# Patient Record
Sex: Female | Born: 1950
Health system: Southern US, Community
[De-identification: ages and names within clinical notes are randomized; demographics above are authoritative.]

## PROBLEM LIST (undated history)

## (undated) DIAGNOSIS — J302 Other seasonal allergic rhinitis: Secondary | ICD-10-CM

## (undated) DIAGNOSIS — T7840XA Allergy, unspecified, initial encounter: Secondary | ICD-10-CM

## (undated) DIAGNOSIS — E785 Hyperlipidemia, unspecified: Secondary | ICD-10-CM

## (undated) DIAGNOSIS — I10 Essential (primary) hypertension: Secondary | ICD-10-CM

## (undated) DIAGNOSIS — E669 Obesity, unspecified: Secondary | ICD-10-CM

## (undated) DIAGNOSIS — E559 Vitamin D deficiency, unspecified: Secondary | ICD-10-CM

## (undated) DIAGNOSIS — G5600 Carpal tunnel syndrome, unspecified upper limb: Secondary | ICD-10-CM

## (undated) HISTORY — DX: Vitamin D deficiency, unspecified: E55.9

## (undated) HISTORY — DX: Allergy, unspecified, initial encounter: T78.40XA

## (undated) HISTORY — DX: Obesity, unspecified: E66.9

## (undated) HISTORY — DX: Essential (primary) hypertension: I10

## (undated) HISTORY — DX: Hyperlipidemia, unspecified: E78.5

## (undated) HISTORY — DX: Carpal tunnel syndrome, unspecified upper limb: G56.00

## (undated) HISTORY — DX: Other seasonal allergic rhinitis: J30.2

---
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
## 1974-01-16 HISTORY — PX: OTHER SURGICAL HISTORY: SHX169

## 1975-01-17 HISTORY — PX: CYST EXCISION: SHX5701

## 1979-01-17 HISTORY — PX: KNEE ARTHROTOMY: SUR107

## 1994-01-16 HISTORY — PX: CYST REMOVAL HAND: SHX6279

## 1995-01-17 HISTORY — PX: CERVICAL DISCECTOMY: SHX98

## 1999-01-06 ENCOUNTER — Other Ambulatory Visit: Admission: RE | Admit: 1999-01-06 | Discharge: 1999-01-06 | Payer: Self-pay | Admitting: Obstetrics and Gynecology

## 1999-01-27 ENCOUNTER — Encounter: Admission: RE | Admit: 1999-01-27 | Discharge: 1999-01-27 | Payer: Self-pay | Admitting: Obstetrics and Gynecology

## 1999-01-27 ENCOUNTER — Encounter: Payer: Self-pay | Admitting: Obstetrics and Gynecology

## 2000-01-30 ENCOUNTER — Encounter: Admission: RE | Admit: 2000-01-30 | Discharge: 2000-01-30 | Payer: Self-pay | Admitting: Obstetrics and Gynecology

## 2000-01-30 ENCOUNTER — Encounter: Payer: Self-pay | Admitting: Obstetrics and Gynecology

## 2000-03-08 ENCOUNTER — Other Ambulatory Visit: Admission: RE | Admit: 2000-03-08 | Discharge: 2000-03-08 | Payer: Self-pay | Admitting: Obstetrics and Gynecology

## 2001-04-05 ENCOUNTER — Encounter: Admission: RE | Admit: 2001-04-05 | Discharge: 2001-04-05 | Payer: Self-pay | Admitting: Obstetrics and Gynecology

## 2001-04-05 ENCOUNTER — Encounter: Payer: Self-pay | Admitting: Obstetrics and Gynecology

## 2001-05-23 ENCOUNTER — Other Ambulatory Visit: Admission: RE | Admit: 2001-05-23 | Discharge: 2001-05-23 | Payer: Self-pay | Admitting: Obstetrics and Gynecology

## 2002-04-09 ENCOUNTER — Encounter: Admission: RE | Admit: 2002-04-09 | Discharge: 2002-04-09 | Payer: Self-pay | Admitting: Obstetrics and Gynecology

## 2002-04-09 ENCOUNTER — Encounter: Payer: Self-pay | Admitting: Obstetrics and Gynecology

## 2002-06-26 ENCOUNTER — Other Ambulatory Visit: Admission: RE | Admit: 2002-06-26 | Discharge: 2002-06-26 | Payer: Self-pay | Admitting: Obstetrics and Gynecology

## 2003-05-26 ENCOUNTER — Encounter: Admission: RE | Admit: 2003-05-26 | Discharge: 2003-05-26 | Payer: Self-pay | Admitting: Obstetrics and Gynecology

## 2003-12-07 ENCOUNTER — Ambulatory Visit: Payer: Self-pay | Admitting: Gastroenterology

## 2003-12-21 ENCOUNTER — Ambulatory Visit: Payer: Self-pay | Admitting: Gastroenterology

## 2004-06-01 ENCOUNTER — Encounter: Admission: RE | Admit: 2004-06-01 | Discharge: 2004-06-01 | Payer: Self-pay | Admitting: Obstetrics and Gynecology

## 2005-06-13 ENCOUNTER — Encounter: Admission: RE | Admit: 2005-06-13 | Discharge: 2005-06-13 | Payer: Self-pay | Admitting: Obstetrics and Gynecology

## 2007-10-24 ENCOUNTER — Encounter: Admission: RE | Admit: 2007-10-24 | Discharge: 2007-10-24 | Payer: Self-pay | Admitting: Obstetrics and Gynecology

## 2008-12-24 ENCOUNTER — Encounter: Admission: RE | Admit: 2008-12-24 | Discharge: 2008-12-24 | Payer: Self-pay | Admitting: Obstetrics and Gynecology

## 2010-04-19 ENCOUNTER — Other Ambulatory Visit: Payer: Self-pay | Admitting: Obstetrics and Gynecology

## 2010-04-19 DIAGNOSIS — Z1231 Encounter for screening mammogram for malignant neoplasm of breast: Secondary | ICD-10-CM

## 2010-04-25 ENCOUNTER — Other Ambulatory Visit: Payer: Self-pay | Admitting: Dermatology

## 2010-04-28 ENCOUNTER — Ambulatory Visit
Admission: RE | Admit: 2010-04-28 | Discharge: 2010-04-28 | Disposition: A | Discharge status: Home / Self Care | Payer: BLUE CROSS/BLUE SHIELD | Source: Ambulatory Visit | Attending: Obstetrics and Gynecology | Admitting: Obstetrics and Gynecology

## 2010-04-28 DIAGNOSIS — Z1231 Encounter for screening mammogram for malignant neoplasm of breast: Secondary | ICD-10-CM

## 2010-04-29 ENCOUNTER — Other Ambulatory Visit: Payer: Self-pay | Admitting: Obstetrics and Gynecology

## 2010-04-29 DIAGNOSIS — R928 Other abnormal and inconclusive findings on diagnostic imaging of breast: Secondary | ICD-10-CM

## 2010-05-04 ENCOUNTER — Ambulatory Visit
Admission: RE | Admit: 2010-05-04 | Discharge: 2010-05-04 | Disposition: A | Discharge status: Home / Self Care | Payer: BLUE CROSS/BLUE SHIELD | Source: Ambulatory Visit | Attending: Obstetrics and Gynecology | Admitting: Obstetrics and Gynecology

## 2010-05-04 DIAGNOSIS — R928 Other abnormal and inconclusive findings on diagnostic imaging of breast: Secondary | ICD-10-CM

## 2010-06-20 ENCOUNTER — Other Ambulatory Visit: Payer: Self-pay | Admitting: Obstetrics and Gynecology

## 2010-07-25 ENCOUNTER — Other Ambulatory Visit: Payer: Self-pay | Admitting: Dermatology

## 2011-01-17 HISTORY — PX: ROTATOR CUFF REPAIR: SHX139

## 2011-05-16 ENCOUNTER — Other Ambulatory Visit: Payer: Self-pay | Admitting: Dermatology

## 2011-05-24 ENCOUNTER — Other Ambulatory Visit: Payer: Self-pay | Admitting: Obstetrics and Gynecology

## 2011-05-24 DIAGNOSIS — Z1231 Encounter for screening mammogram for malignant neoplasm of breast: Secondary | ICD-10-CM

## 2011-06-06 ENCOUNTER — Ambulatory Visit: Payer: BLUE CROSS/BLUE SHIELD

## 2011-06-06 ENCOUNTER — Ambulatory Visit
Admission: RE | Admit: 2011-06-06 | Discharge: 2011-06-06 | Disposition: A | Discharge status: Home / Self Care | Payer: BC Managed Care – PPO | Source: Ambulatory Visit | Attending: Obstetrics and Gynecology | Admitting: Obstetrics and Gynecology

## 2011-06-06 DIAGNOSIS — Z1231 Encounter for screening mammogram for malignant neoplasm of breast: Secondary | ICD-10-CM

## 2011-11-16 ENCOUNTER — Encounter: Payer: Self-pay | Admitting: Gastroenterology

## 2012-02-09 ENCOUNTER — Other Ambulatory Visit: Payer: Self-pay | Admitting: Dermatology

## 2012-05-21 ENCOUNTER — Other Ambulatory Visit: Payer: Self-pay | Admitting: Dermatology

## 2012-07-22 ENCOUNTER — Other Ambulatory Visit: Payer: Self-pay

## 2012-07-22 DIAGNOSIS — Z1231 Encounter for screening mammogram for malignant neoplasm of breast: Secondary | ICD-10-CM

## 2012-08-08 ENCOUNTER — Ambulatory Visit
Admission: RE | Admit: 2012-08-08 | Discharge: 2012-08-08 | Disposition: A | Discharge status: Home / Self Care | Payer: BC Managed Care – PPO | Source: Ambulatory Visit

## 2012-08-08 DIAGNOSIS — Z1231 Encounter for screening mammogram for malignant neoplasm of breast: Secondary | ICD-10-CM

## 2012-09-18 ENCOUNTER — Encounter: Payer: Self-pay | Admitting: Gastroenterology

## 2013-05-15 ENCOUNTER — Encounter: Payer: Self-pay | Admitting: Gastroenterology

## 2013-06-11 ENCOUNTER — Ambulatory Visit (AMBULATORY_SURGERY_CENTER): Payer: Self-pay | Admitting: *Deleted

## 2013-06-11 VITALS — Ht 63.0 in | Wt 184.2 lb

## 2013-06-11 DIAGNOSIS — Z1211 Encounter for screening for malignant neoplasm of colon: Secondary | ICD-10-CM

## 2013-06-11 MED ORDER — MOVIPREP 100 G PO SOLR: ORAL | Status: DC

## 2013-06-11 NOTE — Progress Notes (Signed)
No allergies to eggs or soy. No problems with anesthesia.  Pt given Emmi instructions for colonoscopy  No oxygen use  Takes Qsymia; was told to stop 10 days before colonoscopy

## 2013-06-12 ENCOUNTER — Encounter: Payer: Self-pay | Admitting: Gastroenterology

## 2013-06-16 ENCOUNTER — Encounter: Payer: Self-pay | Admitting: Gastroenterology

## 2013-06-25 ENCOUNTER — Ambulatory Visit (AMBULATORY_SURGERY_CENTER): Payer: BC Managed Care – PPO | Admitting: Gastroenterology

## 2013-06-25 ENCOUNTER — Encounter: Payer: Self-pay | Admitting: Gastroenterology

## 2013-06-25 VITALS — BP 150/77 | HR 62 | Temp 97.5°F | Resp 21 | Ht 63.0 in | Wt 184.0 lb

## 2013-06-25 DIAGNOSIS — D126 Benign neoplasm of colon, unspecified: Secondary | ICD-10-CM

## 2013-06-25 DIAGNOSIS — Z1211 Encounter for screening for malignant neoplasm of colon: Secondary | ICD-10-CM

## 2013-06-25 MED ORDER — SODIUM CHLORIDE 0.9 % IV SOLN: 500.0000 mL | INTRAVENOUS | Status: DC

## 2013-06-25 NOTE — Op Note (Signed)
[place]  [address]. [place], [id number]   COLONOSCOPY PROCEDURE REPORT  PATIENT: Kara Oneill, Kara Oneill  MR#: [id number] BIRTHDATE: Aug 06, 1950 , 62  yrs. old GENDER: Female ENDOSCOPIST: Ladene Artist, MD, [facility] PROCEDURE DATE:  06/25/2013 PROCEDURE:   Colonoscopy with snare polypectomy First Screening Colonoscopy - Avg.  risk and is 50 yrs.  old or older - No.  Prior Negative Screening - Now for repeat screening. 10 or more years since last screening  History of Adenoma - Now for follow-up colonoscopy & has been > or = to 3 yrs.  N/A  Polyps Removed Today? Yes. ASA CLASS:   Class II INDICATIONS:average risk screening. MEDICATIONS: MAC sedation, administered by CRNA and propofol (Diprivan) 200mg  IV DESCRIPTION OF PROCEDURE:   After the risks benefits and alternatives of the procedure were thoroughly explained, informed consent was obtained.  A digital rectal exam revealed no abnormalities of the rectum.   The LB [id number]  endoscope was introduced through the anus and advanced to the cecum, which was identified by both the appendix and ileocecal valve. No adverse events experienced.   The quality of the prep was excellent, using MoviPrep  The instrument was then slowly withdrawn as the colon was fully examined.  COLON FINDINGS: A sessile polyp measuring 6 mm in size was found in the ascending colon.  A polypectomy was performed with a cold snare.  The resection was complete and the polyp tissue was completely retrieved.   Mild diverticulosis was noted in the sigmoid colon.   The colon was otherwise normal.  There was no diverticulosis, inflammation, polyps or cancers unless previously stated.  Retroflexed views revealed no abnormalities. The time to cecum=2 minutes 08 seconds.  Withdrawal time=13 minutes 19 seconds. The scope was withdrawn and the procedure completed. COMPLICATIONS: There were no complications.  ENDOSCOPIC IMPRESSION: 1.   Sessile  polyp measuring 6 mm in the ascending colon; polypectomy performed with a cold snare 2.   Mild diverticulosis in the sigmoid colon  RECOMMENDATIONS: 1.  Await pathology results 2.  Repeat colonoscopy in 5 years if polyp adenomatous; otherwise 10 years  eSigned:  Ladene Artist, MD, [facility] 06/25/2013 1:55 PM   cc: Domenick Gong, MD

## 2013-06-25 NOTE — Patient Instructions (Signed)
YOU HAD AN ENDOSCOPIC PROCEDURE TODAY AT THE [facility]: Refer to the procedure report that was given to you for any specific questions about what was found during the examination.  If the procedure report does not answer your questions, please call your gastroenterologist to clarify.  If you requested that your care partner not be given the details of your procedure findings, then the procedure report has been included in a sealed envelope for you to review at your convenience later.  YOU SHOULD EXPECT: Some feelings of bloating in the abdomen. Passage of more gas than usual.  Walking can help get rid of the air that was put into your GI tract during the procedure and reduce the bloating. If you had a lower endoscopy (such as a colonoscopy or flexible sigmoidoscopy) you may notice spotting of blood in your stool or on the toilet paper. If you underwent a bowel prep for your procedure, then you may not have a normal bowel movement for a few days.  DIET: Your first meal following the procedure should be a light meal and then it is ok to progress to your normal diet.  A half-sandwich or bowl of soup is an example of a good first meal.  Heavy or fried foods are harder to digest and may make you feel nauseous or bloated.  Likewise meals heavy in dairy and vegetables can cause extra gas to form and this can also increase the bloating.  Drink plenty of fluids but you should avoid alcoholic beverages for 24 hours.  ACTIVITY: Your care partner should take you home directly after the procedure.  You should plan to take it easy, moving slowly for the rest of the day.  You can resume normal activity the day after the procedure however you should NOT DRIVE or use heavy machinery for 24 hours (because of the sedation medicines used during the test).    SYMPTOMS TO REPORT IMMEDIATELY: A gastroenterologist can be reached at any hour.  During normal business hours, 8:30 AM to 5:00 PM Monday through Friday,  call [phone].  After hours and on weekends, please call the GI answering service at [phone] who will take a message and have the physician on call contact you.   Following lower endoscopy (colonoscopy or flexible sigmoidoscopy):  Excessive amounts of blood in the stool  Significant tenderness or worsening of abdominal pains  Swelling of the abdomen that is new, acute  Fever of 100F or higher  FOLLOW UP: If any biopsies were taken you will be contacted by phone or by letter within the next 1-3 weeks.  Call your gastroenterologist if you have not heard about the biopsies in 3 weeks.  Our staff will call the home number listed on your records the next business day following your procedure to check on you and address any questions or concerns that you may have at that time regarding the information given to you following your procedure. This is a courtesy call and so if there is no answer at the home number and we have not heard from you through the emergency physician on call, we will assume that you have returned to your regular daily activities without incident.  SIGNATURES/CONFIDENTIALITY: You and/or your care partner have signed paperwork which will be entered into your electronic medical record.  These signatures attest to the fact that that the information above on your After Visit Summary has been reviewed and is understood.  Full responsibility of the confidentiality of this   discharge information lies with you and/or your care-partner.     Handouts were given to your care partner on polyps, diverticulosis, and a high fiber diet with liberal fluid intake. You may resume your current medications today. Await biopsy results. Please call if any questions or concerns.   

## 2013-06-25 NOTE — Progress Notes (Signed)
No complaints noted in the recovery room. Maw   

## 2013-06-25 NOTE — Progress Notes (Signed)
Called to room to assist during endoscopic procedure.  Patient ID and intended procedure confirmed with present staff. Received instructions for my participation in the procedure from the performing physician.  

## 2013-06-25 NOTE — Progress Notes (Signed)
A/ox3 pleased with MAC, report to Annette RN 

## 2013-06-26 ENCOUNTER — Telehealth: Payer: Self-pay | Admitting: *Deleted

## 2013-06-26 NOTE — Telephone Encounter (Signed)
  Follow up Call-  Call back number 06/25/2013  Post procedure Call Back phone  # [phone] or [phone]  Permission to leave phone message Yes     Patient questions:  Do you have a fever, pain , or abdominal swelling? no Pain Score  0 *  Have you tolerated food without any problems? yes  Have you been able to return to your normal activities? yes  Do you have any questions about your discharge instructions: Diet   no Medications  no Follow up visit  no  Do you have questions or concerns about your Care? no  Actions: * If pain score is 4 or above:0 No action needed, pain <4.

## 2013-07-02 ENCOUNTER — Encounter: Payer: Self-pay | Admitting: Gastroenterology

## 2013-08-15 ENCOUNTER — Encounter: Payer: Self-pay | Admitting: Gastroenterology

## 2014-03-27 ENCOUNTER — Other Ambulatory Visit: Payer: Self-pay

## 2014-03-27 DIAGNOSIS — Z1231 Encounter for screening mammogram for malignant neoplasm of breast: Secondary | ICD-10-CM

## 2014-04-01 ENCOUNTER — Ambulatory Visit
Admission: RE | Admit: 2014-04-01 | Discharge: 2014-04-01 | Disposition: A | Discharge status: Home / Self Care | Payer: BC Managed Care – PPO | Source: Ambulatory Visit

## 2014-04-01 ENCOUNTER — Encounter (INDEPENDENT_AMBULATORY_CARE_PROVIDER_SITE_OTHER): Payer: Self-pay

## 2014-04-01 DIAGNOSIS — Z1231 Encounter for screening mammogram for malignant neoplasm of breast: Secondary | ICD-10-CM

## 2015-10-06 ENCOUNTER — Other Ambulatory Visit: Payer: Self-pay | Admitting: Obstetrics and Gynecology

## 2015-10-06 DIAGNOSIS — Z1231 Encounter for screening mammogram for malignant neoplasm of breast: Secondary | ICD-10-CM

## 2015-10-14 ENCOUNTER — Ambulatory Visit
Admission: RE | Admit: 2015-10-14 | Discharge: 2015-10-14 | Disposition: A | Discharge status: Home / Self Care | Payer: BC Managed Care – PPO | Source: Ambulatory Visit | Attending: Obstetrics and Gynecology | Admitting: Obstetrics and Gynecology

## 2015-10-14 DIAGNOSIS — Z1231 Encounter for screening mammogram for malignant neoplasm of breast: Secondary | ICD-10-CM

## 2016-10-10 ENCOUNTER — Other Ambulatory Visit: Payer: Self-pay | Admitting: Obstetrics and Gynecology

## 2016-10-10 DIAGNOSIS — Z1231 Encounter for screening mammogram for malignant neoplasm of breast: Secondary | ICD-10-CM

## 2016-10-20 ENCOUNTER — Ambulatory Visit
Admission: RE | Admit: 2016-10-20 | Discharge: 2016-10-20 | Disposition: A | Discharge status: Home / Self Care | Payer: BC Managed Care – PPO | Source: Ambulatory Visit | Attending: Obstetrics and Gynecology | Admitting: Obstetrics and Gynecology

## 2016-10-20 DIAGNOSIS — Z1231 Encounter for screening mammogram for malignant neoplasm of breast: Secondary | ICD-10-CM

## 2018-09-17 HISTORY — PX: CATARACT EXTRACTION: SUR2

## 2019-02-10 ENCOUNTER — Ambulatory Visit: Payer: Self-pay | Attending: Internal Medicine

## 2019-02-10 DIAGNOSIS — Z23 Encounter for immunization: Secondary | ICD-10-CM | POA: Insufficient documentation

## 2019-02-10 NOTE — Progress Notes (Signed)
   Covid-19 Vaccination Clinic  Name:  Kara Oneill    MRN: [id number] DOB: 1950/05/02  02/10/2019  Ms. Kara Oneill was observed post Covid-19 immunization for 15 minutes without incidence. She was provided with Vaccine Information Sheet and instruction to access the V-Safe system.   Ms. Kara Oneill was instructed to call 911 with any severe reactions post vaccine: Marland Kitchen Difficulty breathing  . Swelling of your face and throat  . A fast heartbeat  . A bad rash all over your body  . Dizziness and weakness    Immunizations Administered    Name Date Dose VIS Date Route   Pfizer COVID-19 Vaccine 02/10/2019 10:33 AM 0.3 mL 12/27/2018 Intramuscular   Manufacturer: [place]   Lot: [id number]   [place]: [id number]

## 2019-03-03 ENCOUNTER — Ambulatory Visit: Payer: Self-pay | Attending: Internal Medicine

## 2019-03-03 DIAGNOSIS — Z23 Encounter for immunization: Secondary | ICD-10-CM | POA: Insufficient documentation

## 2019-03-03 NOTE — Progress Notes (Signed)
   Covid-19 Vaccination Clinic  Name:  Kara Oneill    MRN: [id number] DOB: 1950-05-01  03/03/2019  [name] was observed post Covid-19 immunization for 15 minutes without incidence. She was provided with Vaccine Information Sheet and instruction to access the V-Safe system.   [name] was instructed to call 911 with any severe reactions post vaccine: Marland Kitchen Difficulty breathing  . Swelling of your face and throat  . A fast heartbeat  . A bad rash all over your body  . Dizziness and weakness    Immunizations Administered    Name Date Dose VIS Date Route   Pfizer COVID-19 Vaccine 03/03/2019 10:40 AM 0.3 mL 12/27/2018 Intramuscular   Manufacturer: [place]   Lot: [id number]   [place]: [id number]

## 2019-07-03 ENCOUNTER — Other Ambulatory Visit: Payer: Self-pay | Admitting: Obstetrics and Gynecology

## 2019-07-03 DIAGNOSIS — Z1231 Encounter for screening mammogram for malignant neoplasm of breast: Secondary | ICD-10-CM

## 2019-07-08 ENCOUNTER — Other Ambulatory Visit: Payer: Self-pay

## 2019-07-08 ENCOUNTER — Ambulatory Visit
Admission: RE | Admit: 2019-07-08 | Discharge: 2019-07-08 | Disposition: A | Discharge status: Home / Self Care | Payer: Medicare PPO | Source: Ambulatory Visit | Attending: Obstetrics and Gynecology | Admitting: Obstetrics and Gynecology

## 2019-07-08 DIAGNOSIS — Z1231 Encounter for screening mammogram for malignant neoplasm of breast: Secondary | ICD-10-CM

## 2019-07-11 ENCOUNTER — Other Ambulatory Visit: Payer: Self-pay | Admitting: Obstetrics and Gynecology

## 2019-07-11 DIAGNOSIS — R928 Other abnormal and inconclusive findings on diagnostic imaging of breast: Secondary | ICD-10-CM

## 2019-07-17 ENCOUNTER — Ambulatory Visit
Admission: RE | Admit: 2019-07-17 | Discharge: 2019-07-17 | Disposition: A | Discharge status: Home / Self Care | Payer: Medicare PPO | Source: Ambulatory Visit | Attending: Obstetrics and Gynecology | Admitting: Obstetrics and Gynecology

## 2019-07-17 ENCOUNTER — Other Ambulatory Visit: Payer: Self-pay

## 2019-07-17 DIAGNOSIS — R928 Other abnormal and inconclusive findings on diagnostic imaging of breast: Secondary | ICD-10-CM

## 2019-11-28 ENCOUNTER — Encounter: Payer: Self-pay | Admitting: Gastroenterology

## 2019-12-01 ENCOUNTER — Encounter: Payer: Self-pay | Admitting: Gastroenterology

## 2019-12-26 DIAGNOSIS — H524 Presbyopia: Secondary | ICD-10-CM | POA: Diagnosis not present

## 2019-12-26 DIAGNOSIS — Z961 Presence of intraocular lens: Secondary | ICD-10-CM | POA: Diagnosis not present

## 2019-12-26 DIAGNOSIS — H5212 Myopia, left eye: Secondary | ICD-10-CM | POA: Diagnosis not present

## 2019-12-31 ENCOUNTER — Encounter: Payer: Medicare PPO | Admitting: Gastroenterology

## 2020-01-02 DIAGNOSIS — M5416 Radiculopathy, lumbar region: Secondary | ICD-10-CM | POA: Diagnosis not present

## 2020-01-02 DIAGNOSIS — Z6833 Body mass index (BMI) 33.0-33.9, adult: Secondary | ICD-10-CM | POA: Diagnosis not present

## 2020-01-02 DIAGNOSIS — I10 Essential (primary) hypertension: Secondary | ICD-10-CM | POA: Diagnosis not present

## 2020-01-27 ENCOUNTER — Encounter: Payer: Self-pay | Admitting: Gastroenterology

## 2020-01-27 ENCOUNTER — Ambulatory Visit: Payer: Medicare PPO | Admitting: Gastroenterology

## 2020-01-27 VITALS — BP 120/76 | HR 80 | Ht 63.0 in | Wt 192.0 lb

## 2020-01-27 DIAGNOSIS — R198 Other specified symptoms and signs involving the digestive system and abdomen: Secondary | ICD-10-CM

## 2020-01-27 DIAGNOSIS — R195 Other fecal abnormalities: Secondary | ICD-10-CM | POA: Diagnosis not present

## 2020-01-27 MED ORDER — PLENVU 140 G PO SOLR: 1.0000 | Freq: Once | ORAL | 0 refills | Status: AC

## 2020-01-27 NOTE — Patient Instructions (Signed)
You have been scheduled for a colonoscopy. Please follow written instructions given to you at your visit today.  °Please pick up your prep supplies at the pharmacy within the next 1-3 days. °If you use inhalers (even only as needed), please bring them with you on the day of your procedure. ° °Due to recent changes in healthcare laws, you may see the results of your imaging and laboratory studies on MyChart before your provider has had a chance to review them.  We understand that in some cases there may be results that are confusing or concerning to you. Not all laboratory results come back in the same time frame and the provider may be waiting for multiple results in order to interpret others.  Please give us 48 hours in order for your provider to thoroughly review all the results before contacting the office for clarification of your results.  ° °Thank you for choosing me and [facility]. ° °Malcolm T. Stark, Jr., MD., FACG ° °

## 2020-01-27 NOTE — Progress Notes (Signed)
History of Present Illness: This is a 70 year old female referred by Tisovec, Fransico Him, MD for the evaluation of Hemoccult positive stool.  She has a long history of alternating diarrhea and constipation.  She has determined several foods that exacerbate her symptoms however her symptoms persist.  Symptoms are unchanged.  She was found to have Hemoccult positive stool by Dr. Osborne Casco.  She has no other gastrointestinal complaints.   Unfortunately she had difficulty scheduling an appointment with [place].  She was initially informed that colonoscopy would be delayed because I suffered an injury.  This is incorrect however one of my partners was temporarily out of work after suffering an injury a few months ago.  She was then scheduled for a direct colonoscopy with a nurse previsit however that was canceled and changed to an office visit. Recently we did change our policy for patients with occult blood in stool being directly scheduled for procedures.  Patients with occult blood in stool are to be evaluated in the office and will not be scheduled for a direct procedure unless approved by the responsible GI physician.  I apologized for the difficulty she had scheduling an appointment with [place] and I will ask my office manager to review this. Denies weight loss, abdominal pain, change in stool caliber, melena, hematochezia, nausea, vomiting, dysphagia, reflux symptoms, chest pain.   Colonoscopy 06/2013: one small hyperplastic polyp, mild sigmoid colon diverticulosis, otherwise negative    Allergies  Allergen Reactions  . Lotensin [Benazepril]   . Septra [Sulfamethoxazole-Trimethoprim]   . Flexeril [Cyclobenzaprine] Rash  . Sulfa Antibiotics Rash   Outpatient Medications Prior to Visit  Medication Sig Dispense Refill  . aspirin 81 MG tablet Take 81 mg by mouth daily.    Marland Kitchen BIOTIN PO Take by mouth daily.    . cetirizine (ZYRTEC) 10 MG tablet Take 10 mg by mouth daily.    Marland Kitchen gabapentin (NEURONTIN) 100 MG  capsule Take 100 mg by mouth 3 (three) times daily.    Marland Kitchen losartan-hydrochlorothiazide (HYZAAR) 100-12.5 MG per tablet Take 1 tablet by mouth daily.    . naproxen sodium (ANAPROX) 220 MG tablet Take 220 mg by mouth 2 (two) times daily with a meal.    . pravastatin (PRAVACHOL) 20 MG tablet Take 20 mg by mouth daily.    . Phentermine-Topiramate (QSYMIA) 15-92 MG CP24 Take by mouth daily.     No facility-administered medications prior to visit.   Past Medical History:  Diagnosis Date  . Carpal tunnel syndrome   . Hyperlipidemia   . Hypertension   . Obesity   . Seasonal allergies   . Vitamin D deficiency    Past Surgical History:  Procedure Laterality Date  . bone spur Left 1976   foot  . CATARACT EXTRACTION Bilateral 09/2018  . CERVICAL DISCECTOMY  1997  . CESAREAN SECTION  1981  . CYST EXCISION Left 1977   foot  . CYST REMOVAL HAND Right 1996  . KNEE ARTHROTOMY Right 1981  . ROTATOR CUFF REPAIR Left 2013  . TONSILLECTOMY  1970   Social History   Socioeconomic History  . Marital status: Married    Spouse name: Not on file  . Number of children: Not on file  . Years of education: Not on file  . Highest education level: Not on file  Occupational History  . Not on file  Tobacco Use  . Smoking status: Never Smoker  . Smokeless tobacco: Never Used  Substance and Sexual Activity  .  Alcohol use: Yes    Alcohol/week: 1.0 standard drink    Types: 1 Glasses of wine per week  . Drug use: No  . Sexual activity: Yes  Other Topics Concern  . Not on file  Social History Narrative  . Not on file   Social Determinants of Health   Financial Resource Strain: Not on file  Food Insecurity: Not on file  Transportation Needs: Not on file  Physical Activity: Not on file  Stress: Not on file  Social Connections: Not on file   Family History  Problem Relation Age of Onset  . Breast cancer Mother   . Breast cancer Cousin   . Colon cancer Neg Hx       Review of Systems:  Pertinent positive and negative review of systems were noted in the above HPI section. All other review of systems were otherwise negative.   Gen: Well developed, well nourished, no acute distress Head: Normocephalic and atraumatic Eyes:  sclerae anicteric, EOMI Ears: Normal auditory acuity Mouth: Not examined, mask on during Covid-19 pandemic Neck: Supple, no masses or thyromegaly Lungs: Clear throughout to auscultation Heart: Regular rate and rhythm; no murmurs, rubs or bruits Abdomen: Soft, non tender and non distended. No masses, hepatosplenomegaly or hernias noted. Normal Bowel sounds Rectal: Not done Musculoskeletal: Symmetrical with no gross deformities  Skin: No lesions on visible extremities Pulses:  Normal pulses noted Extremities: No clubbing, cyanosis, edema or deformities noted Neurological: Alert oriented x 4, grossly nonfocal Cervical Nodes:  No significant cervical adenopathy Inguinal Nodes: No significant inguinal adenopathy Psychological:  Alert and cooperative. Normal mood and affect   Assessment and Recommendations:  1. Occult blood in stool. R/O colorectal neoplasms, hemorrhoids, AVM, etc. Schedule colonoscopy. The risks (including bleeding, perforation, infection, missed lesions, medication reactions and possible hospitalization or surgery if complications occur), benefits, and alternatives to colonoscopy with possible biopsy and possible polypectomy were discussed with the patient and they consent to proceed.   2.  Alternating diarrhea and constipation.  Avoid foods that exacerbate symptoms.    cc: Haywood Pao, MD [address] [place],  [place] [address]

## 2020-02-03 ENCOUNTER — Encounter: Payer: Medicare PPO | Admitting: Gastroenterology

## 2020-02-04 ENCOUNTER — Telehealth: Payer: Self-pay | Admitting: *Deleted

## 2020-02-04 NOTE — Telephone Encounter (Signed)
Kara Oneill,  This pt was seen in the office on 01-27-20.  Her colonoscopy is tomorrow 02-05-20.  She is on Phentermine and I don't see that she was instructed to stop it 7 days prior to procedure.  Could you make sure she had stopped it for her procedure?  Thanks, [employer]

## 2020-02-04 NOTE — Telephone Encounter (Signed)
noted 

## 2020-02-04 NOTE — Telephone Encounter (Signed)
I do not see this on the patient's medication list.  She reports she has not taken in a "long time"  "I will never take again and should have been removed from her list".

## 2020-02-05 ENCOUNTER — Other Ambulatory Visit: Payer: Self-pay | Admitting: Gastroenterology

## 2020-02-05 ENCOUNTER — Ambulatory Visit (AMBULATORY_SURGERY_CENTER): Payer: Medicare PPO | Admitting: Gastroenterology

## 2020-02-05 ENCOUNTER — Encounter: Payer: Self-pay | Admitting: Gastroenterology

## 2020-02-05 ENCOUNTER — Other Ambulatory Visit: Payer: Self-pay

## 2020-02-05 VITALS — BP 132/61 | HR 74 | Temp 97.3°F | Resp 24 | Ht 63.0 in | Wt 192.0 lb

## 2020-02-05 DIAGNOSIS — K5732 Diverticulitis of large intestine without perforation or abscess without bleeding: Secondary | ICD-10-CM

## 2020-02-05 DIAGNOSIS — D124 Benign neoplasm of descending colon: Secondary | ICD-10-CM

## 2020-02-05 DIAGNOSIS — E785 Hyperlipidemia, unspecified: Secondary | ICD-10-CM | POA: Diagnosis not present

## 2020-02-05 DIAGNOSIS — R194 Change in bowel habit: Secondary | ICD-10-CM | POA: Diagnosis not present

## 2020-02-05 DIAGNOSIS — D125 Benign neoplasm of sigmoid colon: Secondary | ICD-10-CM

## 2020-02-05 DIAGNOSIS — K633 Ulcer of intestine: Secondary | ICD-10-CM | POA: Diagnosis not present

## 2020-02-05 DIAGNOSIS — D123 Benign neoplasm of transverse colon: Secondary | ICD-10-CM

## 2020-02-05 DIAGNOSIS — R195 Other fecal abnormalities: Secondary | ICD-10-CM

## 2020-02-05 DIAGNOSIS — K640 First degree hemorrhoids: Secondary | ICD-10-CM

## 2020-02-05 DIAGNOSIS — I10 Essential (primary) hypertension: Secondary | ICD-10-CM | POA: Diagnosis not present

## 2020-02-05 MED ORDER — SODIUM CHLORIDE 0.9 % IV SOLN: 500.0000 mL | Freq: Once | INTRAVENOUS | Status: DC

## 2020-02-05 MED ORDER — CIPROFLOXACIN HCL 500 MG PO TABS: 500.0000 mg | ORAL_TABLET | Freq: Two times a day (BID) | ORAL | 0 refills | Status: AC

## 2020-02-05 MED ORDER — METRONIDAZOLE 500 MG PO TABS: 500.0000 mg | ORAL_TABLET | Freq: Two times a day (BID) | ORAL | 0 refills | Status: AC

## 2020-02-05 NOTE — Progress Notes (Signed)
Called to room to assist during endoscopic procedure.  Patient ID and intended procedure confirmed with present staff. Received instructions for my participation in the procedure from the performing physician.  

## 2020-02-05 NOTE — Patient Instructions (Signed)
Discharge instructions given. Handouts on polyps,diverticulosis and hemorrhoids. Prescription sent to pharmacy. Resume previous medications. YOU HAD AN ENDOSCOPIC PROCEDURE TODAY AT THE [facility]:   Refer to the procedure report that was given to you for any specific questions about what was found during the examination.  If the procedure report does not answer your questions, please call your gastroenterologist to clarify.  If you requested that your care partner not be given the details of your procedure findings, then the procedure report has been included in a sealed envelope for you to review at your convenience later.  YOU SHOULD EXPECT: Some feelings of bloating in the abdomen. Passage of more gas than usual.  Walking can help get rid of the air that was put into your GI tract during the procedure and reduce the bloating. If you had a lower endoscopy (such as a colonoscopy or flexible sigmoidoscopy) you may notice spotting of blood in your stool or on the toilet paper. If you underwent a bowel prep for your procedure, you may not have a normal bowel movement for a few days.  Please Note:  You might notice some irritation and congestion in your nose or some drainage.  This is from the oxygen used during your procedure.  There is no need for concern and it should clear up in a day or so.  SYMPTOMS TO REPORT IMMEDIATELY:  Following lower endoscopy (colonoscopy or flexible sigmoidoscopy):  Excessive amounts of blood in the stool  Significant tenderness or worsening of abdominal pains  Swelling of the abdomen that is new, acute  Fever of 100F or higher   For urgent or emergent issues, a gastroenterologist can be reached at any hour by calling [phone]. Do not use MyChart messaging for urgent concerns.    DIET:  We do recommend a small meal at first, but then you may proceed to your regular diet.  Drink plenty of fluids but you should avoid alcoholic beverages for 24  hours.  ACTIVITY:  You should plan to take it easy for the rest of today and you should NOT DRIVE or use heavy machinery until tomorrow (because of the sedation medicines used during the test).    FOLLOW UP: Our staff will call the number listed on your records 48-72 hours following your procedure to check on you and address any questions or concerns that you may have regarding the information given to you following your procedure. If we do not reach you, we will leave a message.  We will attempt to reach you two times.  During this call, we will ask if you have developed any symptoms of COVID 19. If you develop any symptoms (ie: fever, flu-like symptoms, shortness of breath, cough etc.) before then, please call [phone].  If you test positive for Covid 19 in the 2 weeks post procedure, please call and report this information to us.    If any biopsies were taken you will be contacted by phone or by letter within the next 1-3 weeks.  Please call us at [phone] if you have not heard about the biopsies in 3 weeks.    SIGNATURES/CONFIDENTIALITY: You and/or your care partner have signed paperwork which will be entered into your electronic medical record.  These signatures attest to the fact that that the information above on your After Visit Summary has been reviewed and is understood.  Full responsibility of the confidentiality of this discharge information lies with you and/or your care-partner.   

## 2020-02-05 NOTE — Progress Notes (Signed)
Medical history reviewed. VS assessed by C.W 

## 2020-02-05 NOTE — Op Note (Signed)
[facility] Patient Name: Kara Oneill Procedure Date: 02/05/2020 2:24 PM MRN: [id number] Endoscopist: Ladene Artist , MD Age: 70 Referring MD:  Date of Birth: 17-Jul-1950 Gender: Female Account #: [id number] Procedure:                Colonoscopy Indications:              Heme positive stool Medicines:                Monitored Anesthesia Care Procedure:                Pre-Anesthesia Assessment:                           - Prior to the procedure, a History and Physical                            was performed, and patient medications and                            allergies were reviewed. The patient's tolerance of                            previous anesthesia was also reviewed. The risks                            and benefits of the procedure and the sedation                            options and risks were discussed with the patient.                            All questions were answered, and informed consent                            was obtained. Prior Anticoagulants: The patient has                            taken no previous anticoagulant or antiplatelet                            agents. ASA Grade Assessment: II - A patient with                            mild systemic disease. After reviewing the risks                            and benefits, the patient was deemed in                            satisfactory condition to undergo the procedure.                           After obtaining informed consent, the colonoscope  was passed under direct vision. Throughout the                            procedure, the patient's blood pressure, pulse, and                            oxygen saturations were monitored continuously. The                            Colonoscope was introduced through the anus and                            advanced to the the cecum, identified by                            appendiceal orifice and ileocecal valve. The                             ileocecal valve, appendiceal orifice, and rectum                            were photographed. The quality of the bowel                            preparation was good. The colonoscopy was performed                            without difficulty. The patient tolerated the                            procedure well. Scope In: 2:30:42 PM Scope Out: 2:55:21 PM Scope Withdrawal Time: 0 hours 20 minutes 49 seconds  Total Procedure Duration: 0 hours 24 minutes 39 seconds  Findings:                 The perianal and digital rectal examinations were                            normal.                           Four sessile polyps were found in the sigmoid colon                            (1), descending colon (1) and transverse colon (2).                            The polyps were 6 to 8 mm in size. These polyps                            were removed with a cold snare. Resection and                            retrieval were complete.  Multiple small-mouthed diverticula were found in                            the left colon. There was evidence of diverticular                            spasm. Peri-diverticular erythema was seen.                            Purulent discharge, suspected granulation tissue,                            was seen in association with diverticular openings,                            consistent with diverticulitis in two separate                            areas at 20 cm. There was no evidence of                            diverticular bleeding. Two areas were biopsied.                           Internal hemorrhoids were found during                            retroflexion. The hemorrhoids were small and Grade                            I (internal hemorrhoids that do not prolapse).                           The exam was otherwise without abnormality on                            direct and retroflexion views. Complications:             No immediate complications. Estimated blood loss:                            None. Estimated Blood Loss:     Estimated blood loss: none. Impression:               - Four 6 to 8 mm polyps in the sigmoid colon, in                            the descending colon and in the transverse colon,                            removed with a cold snare. Resected and retrieved.                           - Moderate diverticulosis in the left colon.  Purulent discharge, suspected granulation tissue,                            was seen in association with the diverticular                            opening, indicative of diverticulitis. Biopsied.                           - Internal hemorrhoids.                           - The examination was otherwise normal on direct                            and retroflexion views. Recommendation:           - Repeat colonoscopy after studies are complete for                            surveillance based on pathology results.                           - Patient has a contact number available for                            emergencies. The signs and symptoms of potential                            delayed complications were discussed with the                            patient. Return to normal activities tomorrow.                            Written discharge instructions were provided to the                            patient.                           - High fiber diet.                           - Continue present medications.                           - Await pathology results.                           - Cipro (ciprofloxacin) 500 mg PO BID for 7 days.                           - Flagyl (metronidazole) 500 mg PO BID for 7 days. Ladene Artist, MD 02/05/2020 3:04:54 PM This report has been signed electronically.

## 2020-02-05 NOTE — Progress Notes (Signed)
Report to PACU, RN, vss, BBS= Clear.  

## 2020-02-06 ENCOUNTER — Encounter: Payer: Medicare PPO | Admitting: Gastroenterology

## 2020-02-09 ENCOUNTER — Telehealth: Payer: Self-pay | Admitting: *Deleted

## 2020-02-09 NOTE — Telephone Encounter (Signed)
Attempted f/u phone call. No answer. Left message. °

## 2020-02-09 NOTE — Telephone Encounter (Signed)
  Follow up Call-  Call back number 02/05/2020  Post procedure Call Back phone  # [phone]  Permission to leave phone message Yes  Some recent data might be hidden     Patient questions:  Do you have a fever, pain , or abdominal swelling? No. Pain Score  0 *  Have you tolerated food without any problems? Yes.    Have you been able to return to your normal activities? Yes.    Do you have any questions about your discharge instructions: Diet   No. Medications  No. Follow up visit  No.  Do you have questions or concerns about your Care? No.  Actions: * If pain score is 4 or above: No action needed, pain <4.  1. Have you developed a fever since your procedure? no  2.   Have you had an respiratory symptoms (SOB or cough) since your procedure? no  3.   Have you tested positive for COVID 19 since your procedure no  4.   Have you had any family members/close contacts diagnosed with the COVID 19 since your procedure?  no   If yes to any of these questions please route to Joylene John, RN and Joella Prince, RN

## 2020-02-18 ENCOUNTER — Encounter: Payer: Self-pay | Admitting: Gastroenterology

## 2020-06-10 ENCOUNTER — Other Ambulatory Visit: Payer: Medicare PPO

## 2020-08-25 ENCOUNTER — Other Ambulatory Visit: Payer: Self-pay

## 2020-08-25 ENCOUNTER — Other Ambulatory Visit: Payer: Self-pay | Admitting: Obstetrics and Gynecology

## 2020-08-25 ENCOUNTER — Ambulatory Visit
Admission: RE | Admit: 2020-08-25 | Discharge: 2020-08-25 | Disposition: A | Discharge status: Home / Self Care | Payer: Medicare PPO | Source: Ambulatory Visit | Attending: Obstetrics and Gynecology | Admitting: Obstetrics and Gynecology

## 2020-08-25 DIAGNOSIS — Z1231 Encounter for screening mammogram for malignant neoplasm of breast: Secondary | ICD-10-CM

## 2020-10-01 DIAGNOSIS — E559 Vitamin D deficiency, unspecified: Secondary | ICD-10-CM | POA: Diagnosis not present

## 2020-10-01 DIAGNOSIS — R7302 Impaired glucose tolerance (oral): Secondary | ICD-10-CM | POA: Diagnosis not present

## 2020-10-01 DIAGNOSIS — E7800 Pure hypercholesterolemia, unspecified: Secondary | ICD-10-CM | POA: Diagnosis not present

## 2020-10-08 DIAGNOSIS — R7302 Impaired glucose tolerance (oral): Secondary | ICD-10-CM | POA: Diagnosis not present

## 2020-10-08 DIAGNOSIS — E669 Obesity, unspecified: Secondary | ICD-10-CM | POA: Diagnosis not present

## 2020-10-08 DIAGNOSIS — E559 Vitamin D deficiency, unspecified: Secondary | ICD-10-CM | POA: Diagnosis not present

## 2020-10-08 DIAGNOSIS — Z1389 Encounter for screening for other disorder: Secondary | ICD-10-CM | POA: Diagnosis not present

## 2020-10-08 DIAGNOSIS — E876 Hypokalemia: Secondary | ICD-10-CM | POA: Diagnosis not present

## 2020-10-08 DIAGNOSIS — Z1331 Encounter for screening for depression: Secondary | ICD-10-CM | POA: Diagnosis not present

## 2020-10-08 DIAGNOSIS — H8110 Benign paroxysmal vertigo, unspecified ear: Secondary | ICD-10-CM | POA: Diagnosis not present

## 2020-10-08 DIAGNOSIS — D692 Other nonthrombocytopenic purpura: Secondary | ICD-10-CM | POA: Diagnosis not present

## 2020-10-08 DIAGNOSIS — Z23 Encounter for immunization: Secondary | ICD-10-CM | POA: Diagnosis not present

## 2020-10-08 DIAGNOSIS — Z1212 Encounter for screening for malignant neoplasm of rectum: Secondary | ICD-10-CM | POA: Diagnosis not present

## 2020-10-08 DIAGNOSIS — E7800 Pure hypercholesterolemia, unspecified: Secondary | ICD-10-CM | POA: Diagnosis not present

## 2020-10-08 DIAGNOSIS — I10 Essential (primary) hypertension: Secondary | ICD-10-CM | POA: Diagnosis not present

## 2020-10-08 DIAGNOSIS — R82998 Other abnormal findings in urine: Secondary | ICD-10-CM | POA: Diagnosis not present

## 2020-10-08 DIAGNOSIS — Z0000 Encounter for general adult medical examination without abnormal findings: Secondary | ICD-10-CM | POA: Diagnosis not present

## 2020-11-03 DIAGNOSIS — Z23 Encounter for immunization: Secondary | ICD-10-CM | POA: Diagnosis not present

## 2020-12-17 DIAGNOSIS — L570 Actinic keratosis: Secondary | ICD-10-CM | POA: Diagnosis not present

## 2020-12-17 DIAGNOSIS — D225 Melanocytic nevi of trunk: Secondary | ICD-10-CM | POA: Diagnosis not present

## 2020-12-17 DIAGNOSIS — B079 Viral wart, unspecified: Secondary | ICD-10-CM | POA: Diagnosis not present

## 2020-12-17 DIAGNOSIS — Z85828 Personal history of other malignant neoplasm of skin: Secondary | ICD-10-CM | POA: Diagnosis not present

## 2020-12-17 DIAGNOSIS — L590 Erythema ab igne [dermatitis ab igne]: Secondary | ICD-10-CM | POA: Diagnosis not present

## 2020-12-17 DIAGNOSIS — L821 Other seborrheic keratosis: Secondary | ICD-10-CM | POA: Diagnosis not present

## 2020-12-17 DIAGNOSIS — L918 Other hypertrophic disorders of the skin: Secondary | ICD-10-CM | POA: Diagnosis not present

## 2020-12-17 DIAGNOSIS — L820 Inflamed seborrheic keratosis: Secondary | ICD-10-CM | POA: Diagnosis not present

## 2020-12-17 DIAGNOSIS — D485 Neoplasm of uncertain behavior of skin: Secondary | ICD-10-CM | POA: Diagnosis not present

## 2020-12-17 DIAGNOSIS — L814 Other melanin hyperpigmentation: Secondary | ICD-10-CM | POA: Diagnosis not present

## 2020-12-31 DIAGNOSIS — H5212 Myopia, left eye: Secondary | ICD-10-CM | POA: Diagnosis not present

## 2020-12-31 DIAGNOSIS — Z961 Presence of intraocular lens: Secondary | ICD-10-CM | POA: Diagnosis not present

## 2020-12-31 DIAGNOSIS — H524 Presbyopia: Secondary | ICD-10-CM | POA: Diagnosis not present

## 2021-08-10 ENCOUNTER — Other Ambulatory Visit: Payer: Self-pay | Admitting: Internal Medicine

## 2021-08-10 DIAGNOSIS — Z1231 Encounter for screening mammogram for malignant neoplasm of breast: Secondary | ICD-10-CM

## 2021-08-26 ENCOUNTER — Ambulatory Visit
Admission: RE | Admit: 2021-08-26 | Discharge: 2021-08-26 | Disposition: A | Discharge status: Home / Self Care | Payer: Medicare PPO | Source: Ambulatory Visit | Attending: Internal Medicine | Admitting: Internal Medicine

## 2021-08-26 DIAGNOSIS — Z1231 Encounter for screening mammogram for malignant neoplasm of breast: Secondary | ICD-10-CM

## 2021-10-03 DIAGNOSIS — R7989 Other specified abnormal findings of blood chemistry: Secondary | ICD-10-CM | POA: Diagnosis not present

## 2021-10-03 DIAGNOSIS — I10 Essential (primary) hypertension: Secondary | ICD-10-CM | POA: Diagnosis not present

## 2021-10-03 DIAGNOSIS — E559 Vitamin D deficiency, unspecified: Secondary | ICD-10-CM | POA: Diagnosis not present

## 2021-10-03 DIAGNOSIS — R7302 Impaired glucose tolerance (oral): Secondary | ICD-10-CM | POA: Diagnosis not present

## 2021-10-03 DIAGNOSIS — E7800 Pure hypercholesterolemia, unspecified: Secondary | ICD-10-CM | POA: Diagnosis not present

## 2021-10-10 DIAGNOSIS — R7302 Impaired glucose tolerance (oral): Secondary | ICD-10-CM | POA: Diagnosis not present

## 2021-10-10 DIAGNOSIS — L570 Actinic keratosis: Secondary | ICD-10-CM | POA: Diagnosis not present

## 2021-10-10 DIAGNOSIS — E119 Type 2 diabetes mellitus without complications: Secondary | ICD-10-CM | POA: Diagnosis not present

## 2021-10-10 DIAGNOSIS — Z1331 Encounter for screening for depression: Secondary | ICD-10-CM | POA: Diagnosis not present

## 2021-10-10 DIAGNOSIS — I10 Essential (primary) hypertension: Secondary | ICD-10-CM | POA: Diagnosis not present

## 2021-10-10 DIAGNOSIS — H8110 Benign paroxysmal vertigo, unspecified ear: Secondary | ICD-10-CM | POA: Diagnosis not present

## 2021-10-10 DIAGNOSIS — R82998 Other abnormal findings in urine: Secondary | ICD-10-CM | POA: Diagnosis not present

## 2021-10-10 DIAGNOSIS — D692 Other nonthrombocytopenic purpura: Secondary | ICD-10-CM | POA: Diagnosis not present

## 2021-10-10 DIAGNOSIS — L439 Lichen planus, unspecified: Secondary | ICD-10-CM | POA: Diagnosis not present

## 2021-10-10 DIAGNOSIS — Z1339 Encounter for screening examination for other mental health and behavioral disorders: Secondary | ICD-10-CM | POA: Diagnosis not present

## 2021-10-10 DIAGNOSIS — E7800 Pure hypercholesterolemia, unspecified: Secondary | ICD-10-CM | POA: Diagnosis not present

## 2021-10-10 DIAGNOSIS — E669 Obesity, unspecified: Secondary | ICD-10-CM | POA: Diagnosis not present

## 2021-10-10 DIAGNOSIS — Z0000 Encounter for general adult medical examination without abnormal findings: Secondary | ICD-10-CM | POA: Diagnosis not present

## 2021-11-23 DIAGNOSIS — Z23 Encounter for immunization: Secondary | ICD-10-CM | POA: Diagnosis not present

## 2022-01-12 DIAGNOSIS — H5212 Myopia, left eye: Secondary | ICD-10-CM | POA: Diagnosis not present

## 2022-01-12 DIAGNOSIS — Z961 Presence of intraocular lens: Secondary | ICD-10-CM | POA: Diagnosis not present

## 2022-01-13 DIAGNOSIS — L814 Other melanin hyperpigmentation: Secondary | ICD-10-CM | POA: Diagnosis not present

## 2022-01-13 DIAGNOSIS — D225 Melanocytic nevi of trunk: Secondary | ICD-10-CM | POA: Diagnosis not present

## 2022-01-13 DIAGNOSIS — L820 Inflamed seborrheic keratosis: Secondary | ICD-10-CM | POA: Diagnosis not present

## 2022-01-13 DIAGNOSIS — D2361 Other benign neoplasm of skin of right upper limb, including shoulder: Secondary | ICD-10-CM | POA: Diagnosis not present

## 2022-01-13 DIAGNOSIS — D2362 Other benign neoplasm of skin of left upper limb, including shoulder: Secondary | ICD-10-CM | POA: Diagnosis not present

## 2022-01-13 DIAGNOSIS — L570 Actinic keratosis: Secondary | ICD-10-CM | POA: Diagnosis not present

## 2022-01-13 DIAGNOSIS — D485 Neoplasm of uncertain behavior of skin: Secondary | ICD-10-CM | POA: Diagnosis not present

## 2022-01-13 DIAGNOSIS — Z85828 Personal history of other malignant neoplasm of skin: Secondary | ICD-10-CM | POA: Diagnosis not present

## 2022-01-13 DIAGNOSIS — L821 Other seborrheic keratosis: Secondary | ICD-10-CM | POA: Diagnosis not present

## 2022-02-15 ENCOUNTER — Other Ambulatory Visit (HOSPITAL_COMMUNITY): Payer: Self-pay

## 2022-02-15 DIAGNOSIS — R509 Fever, unspecified: Secondary | ICD-10-CM | POA: Diagnosis not present

## 2022-02-15 DIAGNOSIS — R0981 Nasal congestion: Secondary | ICD-10-CM | POA: Diagnosis not present

## 2022-02-15 DIAGNOSIS — Z1152 Encounter for screening for COVID-19: Secondary | ICD-10-CM | POA: Diagnosis not present

## 2022-02-15 DIAGNOSIS — U071 COVID-19: Secondary | ICD-10-CM | POA: Diagnosis not present

## 2022-02-15 DIAGNOSIS — R5383 Other fatigue: Secondary | ICD-10-CM | POA: Diagnosis not present

## 2022-02-15 DIAGNOSIS — G43909 Migraine, unspecified, not intractable, without status migrainosus: Secondary | ICD-10-CM | POA: Diagnosis not present

## 2022-02-15 MED ORDER — PROMETHAZINE-DM 6.25-15 MG/5ML PO SYRP
ORAL_SOLUTION | ORAL | 0 refills | Status: AC
  Filled 2022-02-15: qty 200, 10d supply, fill #0

## 2022-02-15 MED ORDER — LAGEVRIO 200 MG PO CAPS
4.0000 | ORAL_CAPSULE | Freq: Two times a day (BID) | ORAL | 0 refills | Status: DC
  Filled 2022-02-15: qty 40, 5d supply, fill #0

## 2022-04-06 DIAGNOSIS — R7302 Impaired glucose tolerance (oral): Secondary | ICD-10-CM | POA: Diagnosis not present

## 2022-04-06 DIAGNOSIS — M545 Low back pain, unspecified: Secondary | ICD-10-CM | POA: Diagnosis not present

## 2022-04-06 DIAGNOSIS — H8110 Benign paroxysmal vertigo, unspecified ear: Secondary | ICD-10-CM | POA: Diagnosis not present

## 2022-04-06 DIAGNOSIS — I10 Essential (primary) hypertension: Secondary | ICD-10-CM | POA: Diagnosis not present

## 2022-04-06 DIAGNOSIS — L439 Lichen planus, unspecified: Secondary | ICD-10-CM | POA: Diagnosis not present

## 2022-04-06 DIAGNOSIS — E669 Obesity, unspecified: Secondary | ICD-10-CM | POA: Diagnosis not present

## 2022-04-06 DIAGNOSIS — K219 Gastro-esophageal reflux disease without esophagitis: Secondary | ICD-10-CM | POA: Diagnosis not present

## 2022-04-06 DIAGNOSIS — R0609 Other forms of dyspnea: Secondary | ICD-10-CM | POA: Diagnosis not present

## 2022-05-11 NOTE — Progress Notes (Signed)
Cardiology Office Note   Date:  05/12/2022   ID:  Kara Oneill, DOB 1950/07/03, MRN [id number]  PCP:  Gaspar Garbe, MD  Cardiologist:   Rollene Rotunda, MD Referring:  Gaspar Garbe, MD  Chief Complaint  Patient presents with   Shortness of Breath      History of Present Illness: Kara Oneill is a 72 y.o. female who presents for evaluation of SOB and palpitations.  She has no past cardiac history.  She does have family history and other risk factors.  She said that she has been feeling her heart beating for about 6 to 8 months.  It beats fast and hard.  She feels anxious with it.  It seems to happen at rest.  She had 1 episode that lasted about 15 minutes.  It had to go away spontaneously.  She is having a few other episodes that have been less severe.  She does not think she brings them on with any activity or with caffeine.  She denies any chest pressure, neck or arm discomfort.  She does have shortness of breath.  This has been slowly progressive and worse since she had COVID a couple times over the last couple of years.  She thinks her breathing will be sporadic and sometimes she can do a little activity without being short of breath but yard work or [place] typically makes her very short of breath.  She is not having any resting shortness of breath, PND or orthopnea.  Again she denies chest discomfort with this.   Past Medical History:  Diagnosis Date   Carpal tunnel syndrome    Hyperlipidemia    Hypertension    Obesity    Seasonal allergies    Vitamin D deficiency     Past Surgical History:  Procedure Laterality Date   bone spur Left 1976   foot   CATARACT EXTRACTION Bilateral 09/2018   CERVICAL DISCECTOMY  1997   CESAREAN SECTION  1981   CYST EXCISION Left 1977   foot   CYST REMOVAL HAND Right 1996   KNEE ARTHROTOMY Right 1981   ROTATOR CUFF REPAIR Left 2013   TONSILLECTOMY  1970     Current Outpatient Medications   Medication Sig Dispense Refill   aspirin 81 MG tablet Take 81 mg by mouth daily.     BIOTIN PO Take by mouth daily.     cetirizine (ZYRTEC) 10 MG tablet Take 10 mg by mouth daily.     gabapentin (NEURONTIN) 100 MG capsule Take 100 mg by mouth 3 (three) times daily.     losartan-hydrochlorothiazide (HYZAAR) 100-12.5 MG per tablet Take 1 tablet by mouth daily.     meclizine (ANTIVERT) 25 MG tablet Take 25 mg by mouth as needed for dizziness.     naproxen sodium (ANAPROX) 220 MG tablet Take 220 mg by mouth 2 (two) times daily with a meal.     OMEPRAZOLE PO Take 20 mg by mouth daily at 6 (six) AM.     pravastatin (PRAVACHOL) 20 MG tablet Take 20 mg by mouth daily.     Probiotic Product (ALIGN PO) Take by mouth.     promethazine-dextromethorphan (PROMETHAZINE-DM) 6.25-15 MG/5ML syrup Take 5 mL by mouth every 6 hours as needed for cough and nausea for 10 days(caution on sedation) 200 mL 0   No current facility-administered medications for this visit.    Allergies:   Lotensin [benazepril], Septra [sulfamethoxazole-trimethoprim], Flexeril [cyclobenzaprine],  and Sulfa antibiotics    Social History:  The patient  reports that she has never smoked. She has never used smokeless tobacco. She reports current alcohol use of about 1.0 standard drink of alcohol per week. She reports that she does not use drugs.   Family History:  The patient's family history includes Breast cancer in her cousin, maternal aunt, maternal aunt, and mother; CAD (age of onset: 23) in her father; Heart disease in her mother; Stomach cancer in her father.    ROS:  Please see the history of present illness.   Otherwise, review of systems are positive for vertigo.   All other systems are reviewed and negative.    PHYSICAL EXAM: VS:  BP 138/85   Pulse 77   Ht 5\' 3"  (1.6 m)   Wt 210 lb 6.4 oz (95.4 kg)   SpO2 99%   BMI 37.27 kg/m  , BMI Body mass index is 37.27 kg/m. GENERAL:  Well appearing HEENT:  Pupils equal round and  reactive, fundi not visualized, oral mucosa unremarkable NECK:  No jugular venous distention, waveform within normal limits, carotid upstroke brisk and symmetric, no bruits, no thyromegaly LYMPHATICS:  No cervical, inguinal adenopathy LUNGS:  Clear to auscultation bilaterally BACK:  No CVA tenderness CHEST:  Unremarkable HEART:  PMI not displaced or sustained,S1 and S2 within normal limits, no S3, no S4, no clicks, no rubs, no murmurs ABD:  Flat, positive bowel sounds normal in frequency in pitch, no bruits, no rebound, no guarding, no midline pulsatile mass, no hepatomegaly, no splenomegaly EXT:  2 plus pulses throughout, no edema, no cyanosis no clubbing SKIN:  No rashes no nodules NEURO:  Cranial nerves II through XII grossly intact, motor grossly intact throughout PSYCH:  Cognitively intact, oriented to person place and time    EKG:  EKG  ordered today. The ekg ordered today demonstrates sinus rhythm, rate 77, axis within normal limits, intervals within normal limits, no acute ST-T wave changes.   Recent Labs: No results found for requested labs within last 365 days.    Lipid Panel No results found for: "CHOL", "TRIG", "HDL", "CHOLHDL", "VLDL", "LDLCALC", "LDLDIRECT"    Wt Readings from Last 3 Encounters:  05/12/22 210 lb 6.4 oz (95.4 kg)  02/05/20 192 lb (87.1 kg)  01/27/20 192 lb (87.1 kg)      Other studies Reviewed: Additional studies/ records that were reviewed today include: Labs. Review of the above records demonstrates:  Please see elsewhere in the note.     ASSESSMENT AND PLAN:  SOB: The patient's shortness of breath could be an anginal equivalent.  It might also be weight and deconditioning.  My plan is to rule out ischemia and she would be able to walk on a treadmill so we will do a PET scan.  I will also check a BNP.  If these are normal then no further cardiovascular testing is indicated.  Palpitations: I will check a TSH.  I will get a 2-week monitor.   Further management will be based on these results.  Obesity: This could be etiology of some of her complaints.  I counseled her on diet and exercise.   Current medicines are reviewed at length with the patient today.  The patient does not have concerns regarding medicines.  The following changes have been made:  no change  Labs/ tests ordered today include:   Orders Placed This Encounter  Procedures   NM PET CT CARDIAC PERFUSION MULTI W/ABSOLUTE BLOODFLOW   Brain  natriuretic peptide   TSH   LONG TERM MONITOR (3-14 DAYS)   EKG 12-Lead     Disposition:   FU with me as needed.      Signed, Rollene Rotunda, MD  05/12/2022 11:47 AM    [facility]

## 2022-05-12 ENCOUNTER — Encounter: Payer: Self-pay | Admitting: Cardiology

## 2022-05-12 ENCOUNTER — Ambulatory Visit (INDEPENDENT_AMBULATORY_CARE_PROVIDER_SITE_OTHER): Payer: Medicare PPO

## 2022-05-12 ENCOUNTER — Ambulatory Visit: Payer: Medicare PPO | Attending: Cardiology | Admitting: Cardiology

## 2022-05-12 VITALS — BP 138/85 | HR 77 | Ht 63.0 in | Wt 210.4 lb

## 2022-05-12 DIAGNOSIS — R002 Palpitations: Secondary | ICD-10-CM

## 2022-05-12 DIAGNOSIS — R0602 Shortness of breath: Secondary | ICD-10-CM

## 2022-05-12 NOTE — Patient Instructions (Addendum)
Medication Instructions:   Your physician recommends that you continue on your current medications as directed. Please refer to the Current Medication list given to you today.  *If you need a refill on your cardiac medications before your next appointment, please call your pharmacy*  Lab Work: Your physician recommends that you have lab work TODAY:  BNP TSH   If you have labs (blood work) drawn today and your tests are completely normal, you will receive your results only by: MyChart Message (if you have MyChart) OR A paper copy in the mail If you have any lab test that is abnormal or we need to change your treatment, we will call you to review the results.  Testing/Procedures: CARDIAC PET- Your physician has requested that you have a Cardiac Pet Stress Test. This testing is completed at [facility] ([address] [place], [place] [address]). The schedulers will call you to get this scheduled. Please follow instructions below and call the office with any questions/concerns [phone]).  ZIO XT- Long Term Monitor Instructions  Your physician has requested you wear a ZIO patch monitor for 14 days.  This is a single patch monitor. [employer] supplies one patch monitor per enrollment. Additional stickers are not available. Please do not apply patch if you will be having a Nuclear Stress Test,  Echocardiogram, Cardiac CT, MRI, or Chest Xray during the period you would be wearing the  monitor. The patch cannot be worn during these tests. You cannot remove and re-apply the  ZIO XT patch monitor.  Your ZIO patch monitor will be mailed 3 day USPS to your address on file. It may take 3-5 days  to receive your monitor after you have been enrolled.  Once you have received your monitor, please review the enclosed instructions. Your monitor  has already been registered assigning a specific monitor serial # to you.  Billing and Patient Assistance Program Information  We have  supplied [employer] with any of your insurance information on file for billing purposes. [employer] offers a sliding scale Patient Assistance Program for patients that do not have  insurance, or whose insurance does not completely cover the cost of the ZIO monitor.  You must apply for the Patient Assistance Program to qualify for this discounted rate.  To apply, please call [employer] at [phone], select option 4, select option 2, ask to apply for  Patient Assistance Program. Meredeth Ide will ask your household income, and how many people  are in your household. They will quote your out-of-pocket cost based on that information.  [employer] will also be able to set up a 41-month, interest-free payment plan if needed.  Applying the monitor   Shave hair from upper left chest.  Hold abrader disc by orange tab. Rub abrader in 40 strokes over the upper left chest as  indicated in your monitor instructions.  Clean area with 4 enclosed alcohol pads. Let dry.  Apply patch as indicated in monitor instructions. Patch will be placed under collarbone on left  side of chest with arrow pointing upward.  Rub patch adhesive wings for 2 minutes. Remove white label marked "1". Remove the white  label marked "2". Rub patch adhesive wings for 2 additional minutes.  While looking in a mirror, press and release button in center of patch. A small green light will  flash 3-4 times. This will be your only indicator that the monitor has been turned on.  Do not shower for the first 24 hours. You may shower after  the first 24 hours.  Press the button if you feel a symptom. You will hear a small click. Record Date, Time and  Symptom in the Patient Logbook.  When you are ready to remove the patch, follow instructions on the last 2 pages of Patient  Logbook. Stick patch monitor onto the last page of Patient Logbook.  Place Patient Logbook in the blue and white box. Use locking tab on box and tape box closed  securely. The blue and  white box has prepaid postage on it. Please place it in the mailbox as  soon as possible. Your physician should have your test results approximately 7 days after the  monitor has been mailed back to [facility].  Call [facility] Customer Care at [phone] if you have questions regarding  your ZIO XT patch monitor. Call them immediately if you see an orange light blinking on your  monitor.  If your monitor falls off in less than 4 days, contact our Monitor department at [phone].  If your monitor becomes loose or falls off after 4 days call [employer] at [phone] for  suggestions on securing your monitor   Follow-Up: At [facility], you and your health needs are our priority.  As part of our continuing mission to provide you with exceptional heart care, we have created designated Provider Care Teams.  These Care Teams include your primary Cardiologist (physician) and Advanced Practice Providers (APPs -  Physician Assistants and Nurse Practitioners) who all work together to provide you with the care you need, when you need it.    Your next appointment:   As needed if symptoms worsen, fail to improve or develop new symptoms    Provider:   Rollene Rotunda, MD     Other Instructions  How to Prepare for Your Cardiac PET/CT Stress Test:  1. Please do not take these medications before your test:   Medications that may interfere with the cardiac pharmacological stress agent (ex. nitrates - including erectile dysfunction medications, isosorbide mononitrate, tamulosin or beta-blockers) the day of the exam. (Erectile dysfunction medication should be held for at least 72 hrs prior to test) Theophylline containing medications for 12 hours. Dipyridamole 48 hours prior to the test. Your remaining medications may be taken with water.  2. Nothing to eat or drink, except water, 3 hours prior to arrival time.   NO caffeine/decaffeinated products, or chocolate 12 hours prior to  arrival.  3. NO perfume, cologne or lotion  4. Total time is 1 to 2 hours; you may want to bring reading material for the waiting time.  5. Please report to Radiology at the [facility] Main Entrance 30 minutes early for your test.  [address] [place], [place] [address]  Diabetic Preparation:  Hold oral medications. You may take NPH and Lantus insulin. Do not take Humalog or Humulin R (Regular Insulin) the day of your test. Check blood sugars prior to leaving the house. If able to eat breakfast prior to 3 hour fasting, you may take all medications, including your insulin, Do not worry if you miss your breakfast dose of insulin - start at your next meal.  IF YOU THINK YOU MAY BE PREGNANT, OR ARE NURSING PLEASE INFORM THE TECHNOLOGIST.  In preparation for your appointment, medication and supplies will be purchased.  Appointment availability is limited, so if you need to cancel or reschedule, please call the Radiology Department at [phone]  24 hours in advance to avoid a cancellation fee of $  100.00  What to Expect After you Arrive:  Once you arrive and check in for your appointment, you will be taken to a preparation room within the Radiology Department.  A technologist or Nurse will obtain your medical history, verify that you are correctly prepped for the exam, and explain the procedure.  Afterwards,  an IV will be started in your arm and electrodes will be placed on your skin for EKG monitoring during the stress portion of the exam. Then you will be escorted to the PET/CT scanner.  There, staff will get you positioned on the scanner and obtain a blood pressure and EKG.  During the exam, you will continue to be connected to the EKG and blood pressure machines.  A small, safe amount of a radioactive tracer will be injected in your IV to obtain a series of pictures of your heart along with an injection of a stress agent.    After your Exam:  It is recommended that you  eat a meal and drink a caffeinated beverage to counter act any effects of the stress agent.  Drink plenty of fluids for the remainder of the day and urinate frequently for the first couple of hours after the exam.  Your doctor will inform you of your test results within 7-10 business days.  For questions about your test or how to prepare for your test, please call: Rockwell Alexandria, Cardiac Imaging Nurse Navigator  Larey Brick, Cardiac Imaging Nurse Navigator Office: [phone]

## 2022-05-12 NOTE — Progress Notes (Unsigned)
Enrolled for [employer] to mail a ZIO XT long term holter monitor to the patients address on file.  

## 2022-05-13 LAB — TSH: TSH: 4.44 u[IU]/mL (ref 0.450–4.500)

## 2022-05-13 LAB — BRAIN NATRIURETIC PEPTIDE: BNP: 80.3 pg/mL (ref 0.0–100.0)

## 2022-05-22 DIAGNOSIS — R002 Palpitations: Secondary | ICD-10-CM | POA: Diagnosis not present

## 2022-05-22 DIAGNOSIS — R0602 Shortness of breath: Secondary | ICD-10-CM | POA: Diagnosis not present

## 2022-06-08 DIAGNOSIS — R0602 Shortness of breath: Secondary | ICD-10-CM | POA: Diagnosis not present

## 2022-06-08 DIAGNOSIS — R002 Palpitations: Secondary | ICD-10-CM | POA: Diagnosis not present

## 2022-06-13 ENCOUNTER — Telehealth: Payer: Self-pay | Admitting: *Deleted

## 2022-06-13 MED ORDER — METOPROLOL TARTRATE 25 MG PO TABS: 12.5000 mg | ORAL_TABLET | Freq: Two times a day (BID) | ORAL | 3 refills | Status: DC

## 2022-06-13 NOTE — Telephone Encounter (Signed)
-----   Message from Rollene Rotunda, MD sent at 06/12/2022  9:27 AM EDT ----- She does have some PVCs and SVT runs with short runs of NSVT.  I would suggest that this might be what she is feeling and I would start with a low dose of beta blocker to see if that helps.  Start metoprolol tartrate 12.5 mg po bid.

## 2022-06-13 NOTE — Telephone Encounter (Signed)
Spoke with pt, aware of monitor results.  New script sent to the pharmacy

## 2022-07-19 ENCOUNTER — Other Ambulatory Visit (HOSPITAL_COMMUNITY): Payer: Self-pay

## 2022-09-12 ENCOUNTER — Encounter (HOSPITAL_COMMUNITY): Payer: Self-pay

## 2022-09-13 ENCOUNTER — Encounter (HOSPITAL_COMMUNITY)
Admission: RE | Admit: 2022-09-13 | Discharge: 2022-09-13 | Disposition: A | Discharge status: Home / Self Care | Payer: Medicare PPO | Source: Ambulatory Visit | Attending: Cardiology | Admitting: Cardiology

## 2022-09-13 DIAGNOSIS — K449 Diaphragmatic hernia without obstruction or gangrene: Secondary | ICD-10-CM | POA: Diagnosis not present

## 2022-09-13 DIAGNOSIS — R918 Other nonspecific abnormal finding of lung field: Secondary | ICD-10-CM | POA: Diagnosis not present

## 2022-09-13 DIAGNOSIS — I700 Atherosclerosis of aorta: Secondary | ICD-10-CM | POA: Diagnosis not present

## 2022-09-13 DIAGNOSIS — R0602 Shortness of breath: Secondary | ICD-10-CM | POA: Insufficient documentation

## 2022-09-13 LAB — NM PET CT CARDIAC PERFUSION MULTI W/ABSOLUTE BLOODFLOW
LV dias vol: 101 mL (ref 46–106)
LV sys vol: 28 mL
MBFR: 2.54
Nuc Rest EF: 67 %
Nuc Stress EF: 72 %
Peak HR: 81 {beats}/min
Rest HR: 58 {beats}/min
Rest MBF: 0.84 ml/g/min
Rest Nuclear Isotope Dose: 24.6 mCi
ST Depression (mm): 0 mm
Stress MBF: 2.13 ml/g/min
Stress Nuclear Isotope Dose: 24.7 mCi
TID: 1.04

## 2022-09-13 MED ORDER — REGADENOSON 0.4 MG/5ML IV SOLN
0.4000 mg | Freq: Once | INTRAVENOUS | Status: AC
  Administered 2022-09-13: 0.4 mg via INTRAVENOUS

## 2022-09-13 MED ORDER — RUBIDIUM RB82 GENERATOR (RUBYFILL)
20.0000 | PACK | Freq: Once | INTRAVENOUS | Status: AC
  Administered 2022-09-13: 24.58 via INTRAVENOUS

## 2022-09-13 MED ORDER — REGADENOSON 0.4 MG/5ML IV SOLN
INTRAVENOUS | Status: AC
  Filled 2022-09-13: qty 5

## 2022-09-13 MED ORDER — RUBIDIUM RB82 GENERATOR (RUBYFILL)
20.0000 | PACK | Freq: Once | INTRAVENOUS | Status: AC
  Administered 2022-09-13: 24.72 via INTRAVENOUS

## 2022-09-13 NOTE — Progress Notes (Signed)
Patient presents for a cardiac PET stress test and tolerated procedure without incident. Patient maintained acceptable vital signs throughout the test and was offered caffeine after test.  Patient ambulated out of department with a steady gait.  

## 2022-09-15 ENCOUNTER — Encounter: Payer: Self-pay | Admitting: Cardiology

## 2022-09-15 ENCOUNTER — Other Ambulatory Visit (HOSPITAL_COMMUNITY): Payer: Self-pay | Admitting: *Deleted

## 2022-09-15 DIAGNOSIS — R918 Other nonspecific abnormal finding of lung field: Secondary | ICD-10-CM

## 2022-10-03 DIAGNOSIS — Z23 Encounter for immunization: Secondary | ICD-10-CM | POA: Diagnosis not present

## 2022-10-11 DIAGNOSIS — Z1212 Encounter for screening for malignant neoplasm of rectum: Secondary | ICD-10-CM | POA: Diagnosis not present

## 2022-10-11 DIAGNOSIS — E559 Vitamin D deficiency, unspecified: Secondary | ICD-10-CM | POA: Diagnosis not present

## 2022-10-11 DIAGNOSIS — E785 Hyperlipidemia, unspecified: Secondary | ICD-10-CM | POA: Diagnosis not present

## 2022-10-11 DIAGNOSIS — I10 Essential (primary) hypertension: Secondary | ICD-10-CM | POA: Diagnosis not present

## 2022-10-11 DIAGNOSIS — R7301 Impaired fasting glucose: Secondary | ICD-10-CM | POA: Diagnosis not present

## 2022-10-19 DIAGNOSIS — H8110 Benign paroxysmal vertigo, unspecified ear: Secondary | ICD-10-CM | POA: Diagnosis not present

## 2022-10-19 DIAGNOSIS — E669 Obesity, unspecified: Secondary | ICD-10-CM | POA: Diagnosis not present

## 2022-10-19 DIAGNOSIS — I10 Essential (primary) hypertension: Secondary | ICD-10-CM | POA: Diagnosis not present

## 2022-10-19 DIAGNOSIS — D692 Other nonthrombocytopenic purpura: Secondary | ICD-10-CM | POA: Diagnosis not present

## 2022-10-19 DIAGNOSIS — M545 Low back pain, unspecified: Secondary | ICD-10-CM | POA: Diagnosis not present

## 2022-10-19 DIAGNOSIS — K219 Gastro-esophageal reflux disease without esophagitis: Secondary | ICD-10-CM | POA: Diagnosis not present

## 2022-10-19 DIAGNOSIS — Z1339 Encounter for screening examination for other mental health and behavioral disorders: Secondary | ICD-10-CM | POA: Diagnosis not present

## 2022-10-19 DIAGNOSIS — R7302 Impaired glucose tolerance (oral): Secondary | ICD-10-CM | POA: Diagnosis not present

## 2022-10-19 DIAGNOSIS — Z1331 Encounter for screening for depression: Secondary | ICD-10-CM | POA: Diagnosis not present

## 2022-10-19 DIAGNOSIS — E7800 Pure hypercholesterolemia, unspecified: Secondary | ICD-10-CM | POA: Diagnosis not present

## 2022-10-19 DIAGNOSIS — Z0000 Encounter for general adult medical examination without abnormal findings: Secondary | ICD-10-CM | POA: Diagnosis not present

## 2022-10-19 DIAGNOSIS — R82998 Other abnormal findings in urine: Secondary | ICD-10-CM | POA: Diagnosis not present

## 2022-11-21 ENCOUNTER — Other Ambulatory Visit: Payer: Self-pay | Admitting: Internal Medicine

## 2022-11-21 DIAGNOSIS — Z1231 Encounter for screening mammogram for malignant neoplasm of breast: Secondary | ICD-10-CM

## 2022-11-23 ENCOUNTER — Ambulatory Visit
Admission: RE | Admit: 2022-11-23 | Discharge: 2022-11-23 | Disposition: A | Discharge status: Home / Self Care | Payer: Medicare PPO | Source: Ambulatory Visit | Attending: Internal Medicine | Admitting: Internal Medicine

## 2022-11-23 DIAGNOSIS — Z1231 Encounter for screening mammogram for malignant neoplasm of breast: Secondary | ICD-10-CM | POA: Diagnosis not present

## 2022-12-27 DIAGNOSIS — M545 Low back pain, unspecified: Secondary | ICD-10-CM | POA: Diagnosis not present

## 2022-12-29 IMAGING — MG MM DIGITAL SCREENING BILAT W/ TOMO AND CAD
6 of 10 series · 6 of 30 positions shown · non-contrast
Comparison: Previous exam(s).

CLINICAL DATA: Screening.

EXAM:
DIGITAL SCREENING BILATERAL MAMMOGRAM WITH TOMOSYNTHESIS AND CAD
TECHNIQUE: Bilateral screening digital craniocaudal and mediolateral oblique
mammograms were obtained. Bilateral screening digital breast
tomosynthesis was performed. The images were evaluated with
computer-aided detection.

[R CC synth-2D]
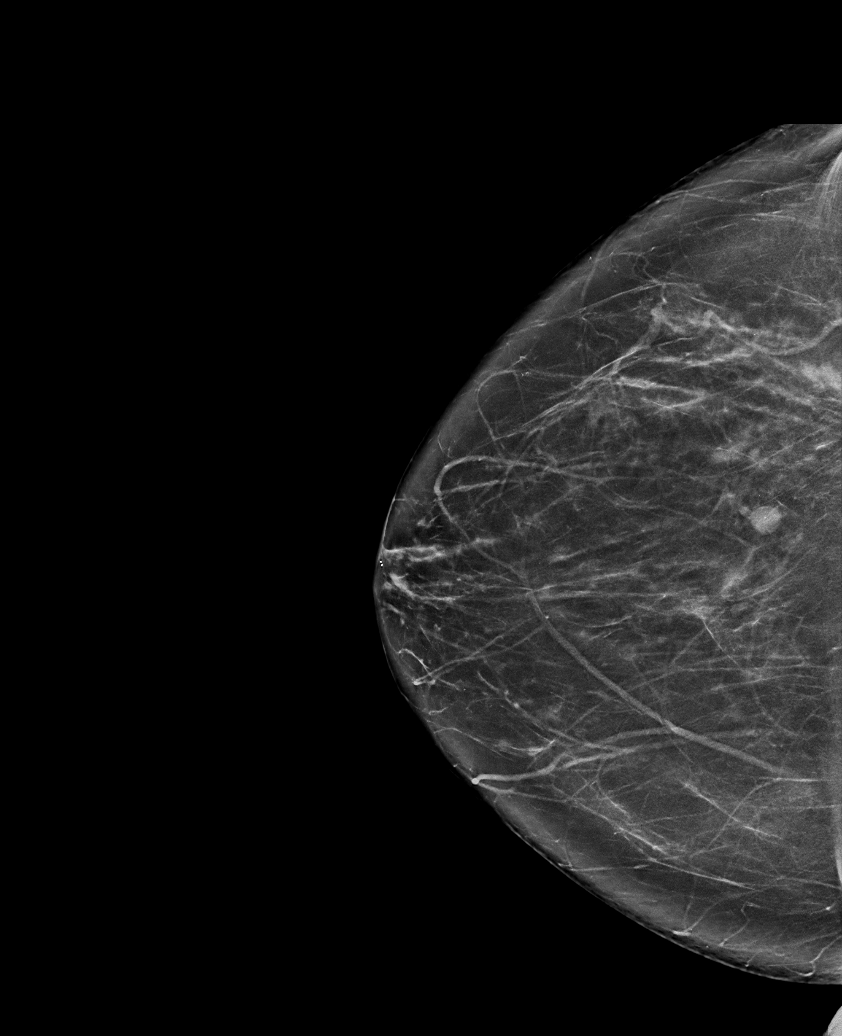

[R MLO synth-2D (1 of 2)]
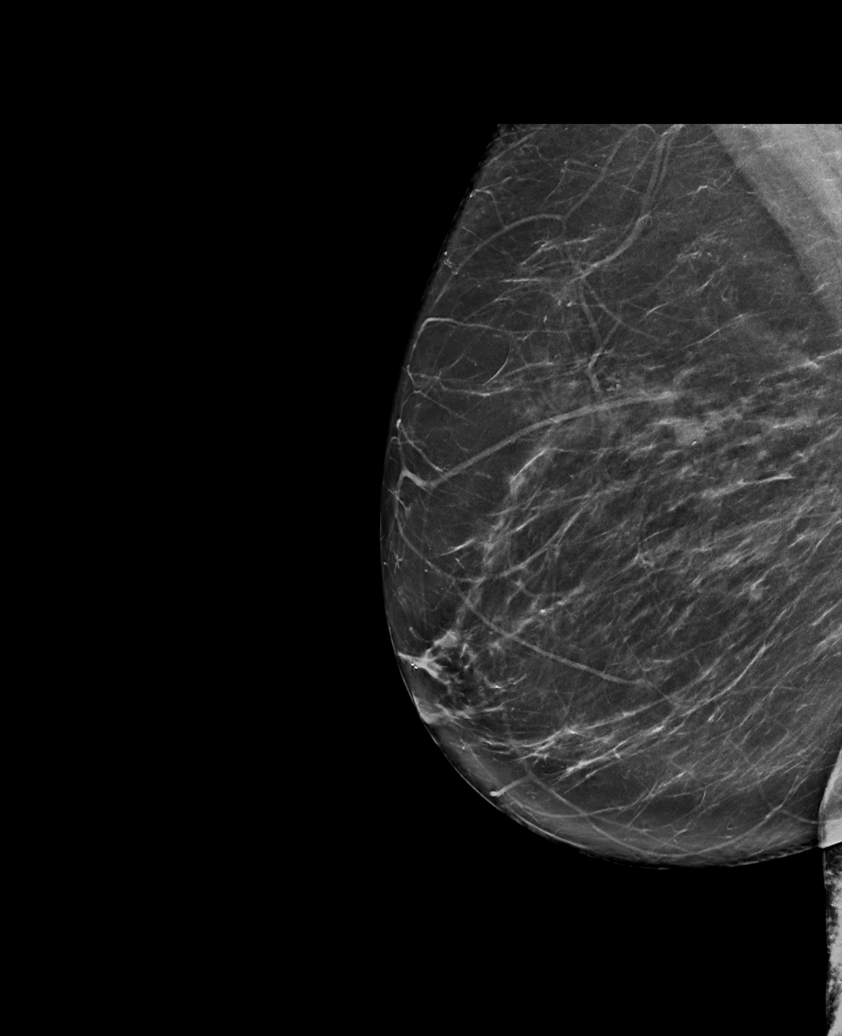

[R MLO synth-2D (2 of 2)]
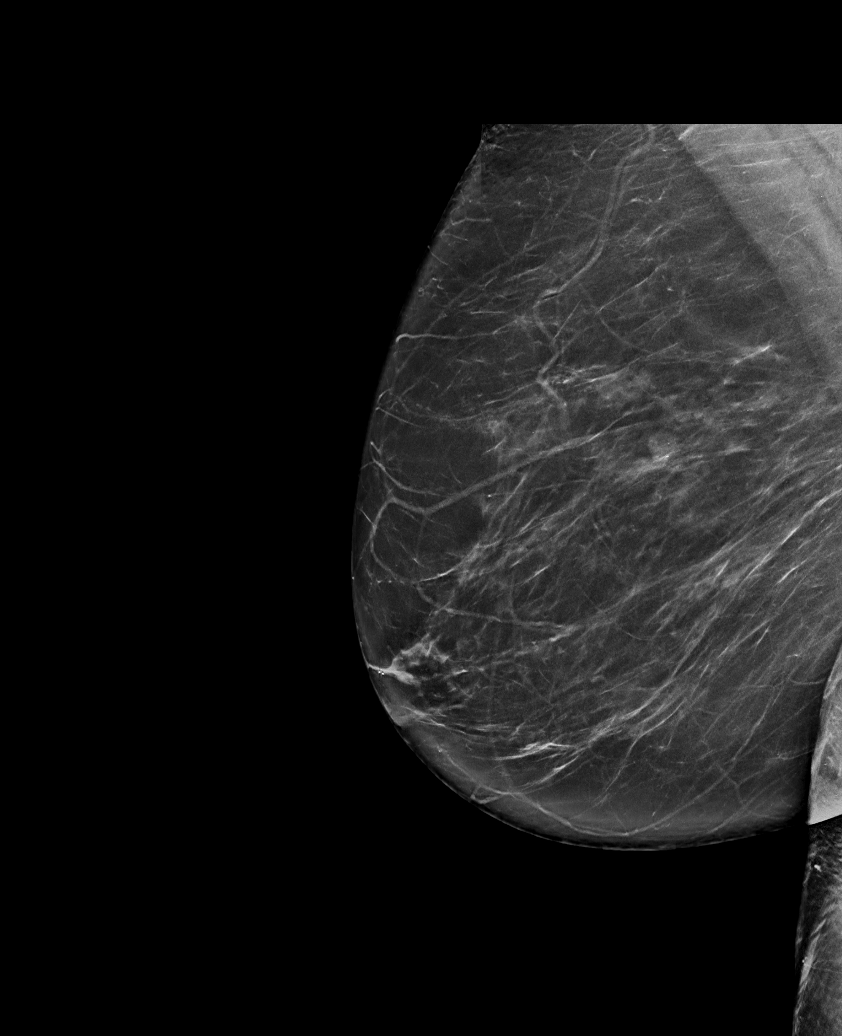

[L CC synth-2D]
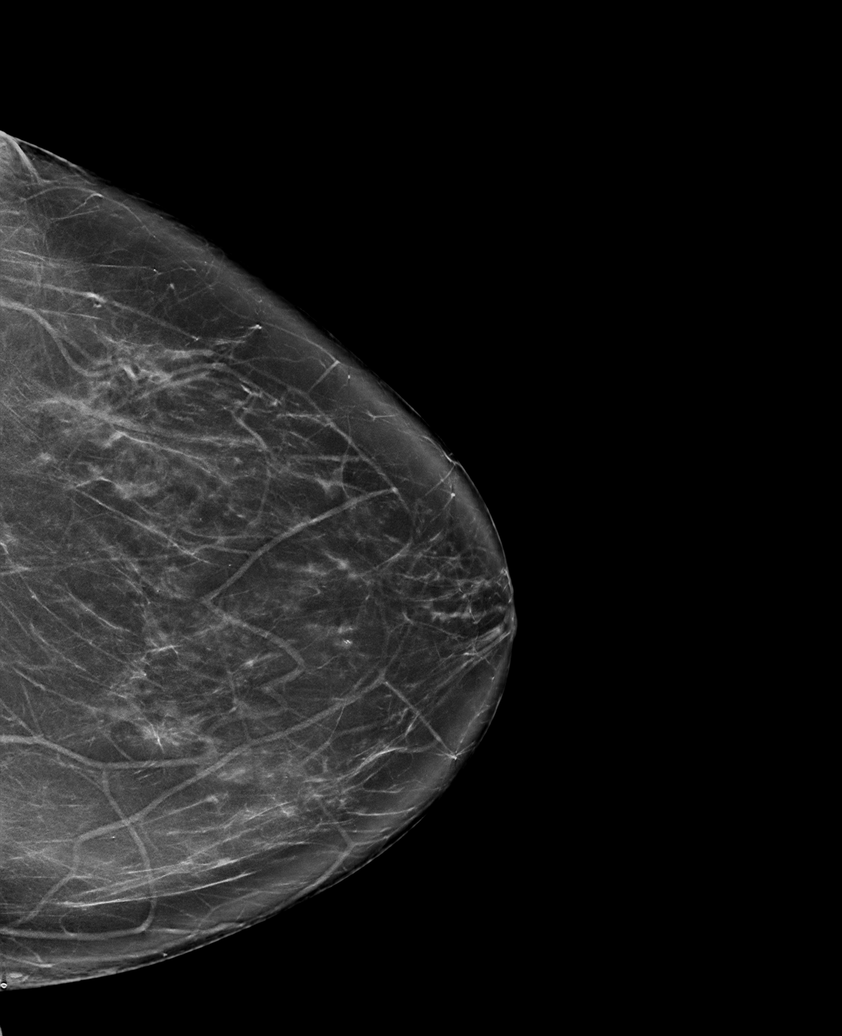

[L MLO synth-2D]
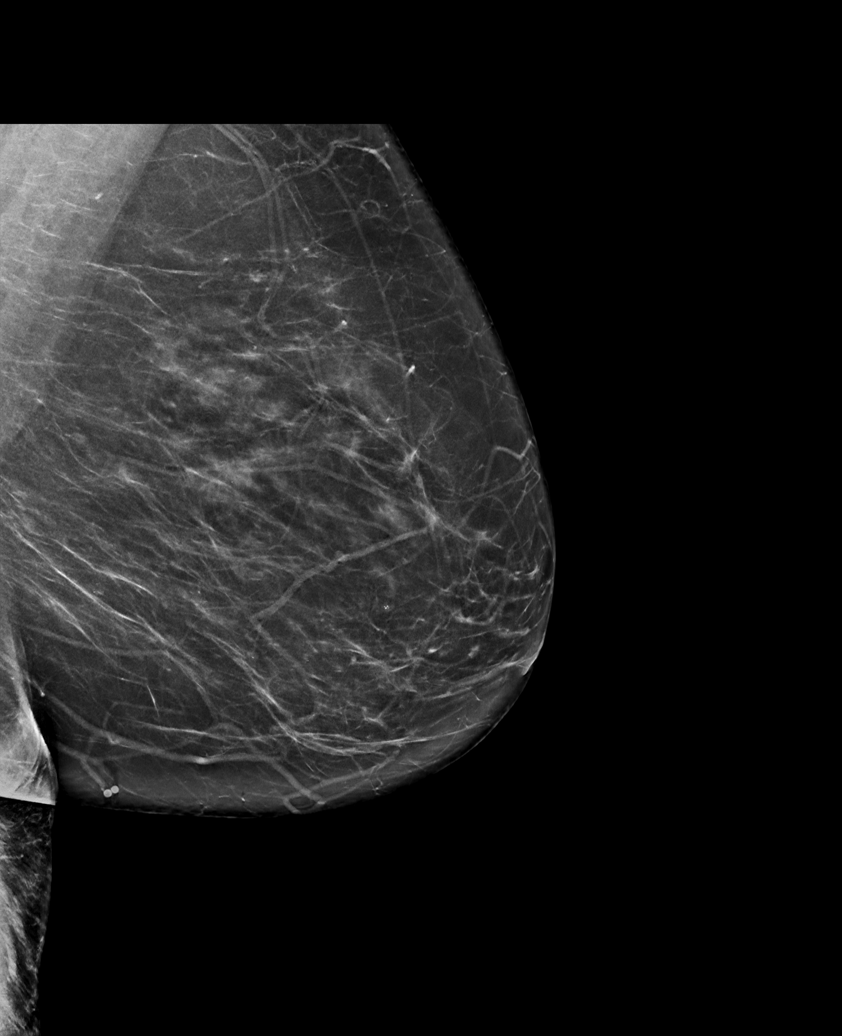

[L CC tomo · tomo slice 41/81.0]
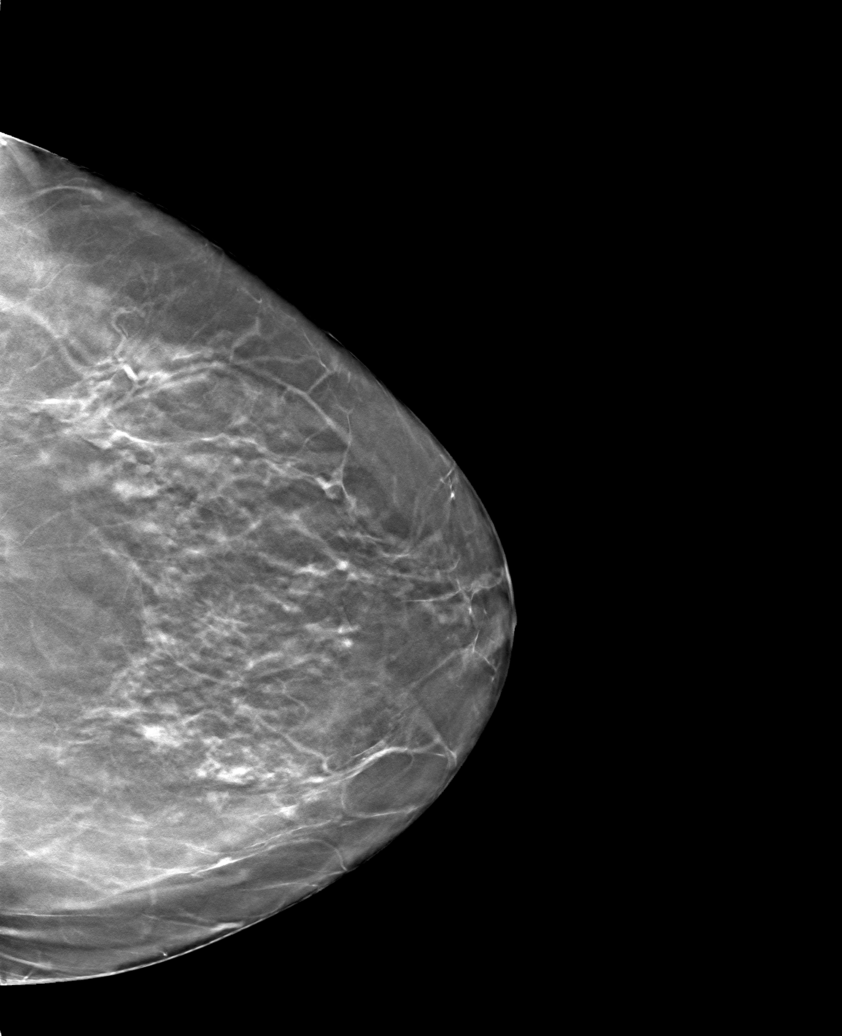

[6 of 30 positions shown; findings below may reference images not displayed]

ACR Breast Density Category b: There are scattered areas of
fibroglandular density.
FINDINGS: There are no findings suspicious for malignancy.
IMPRESSION: No mammographic evidence of malignancy. A result letter of this
screening mammogram will be mailed directly to the patient.

RECOMMENDATION:
Screening mammogram in one year. (Code:51-O-LD2)

BI-RADS CATEGORY  1: Negative.

## 2023-01-18 DIAGNOSIS — J101 Influenza due to other identified influenza virus with other respiratory manifestations: Secondary | ICD-10-CM | POA: Diagnosis not present

## 2023-01-18 DIAGNOSIS — J029 Acute pharyngitis, unspecified: Secondary | ICD-10-CM | POA: Diagnosis not present

## 2023-01-18 DIAGNOSIS — R051 Acute cough: Secondary | ICD-10-CM | POA: Diagnosis not present

## 2023-01-18 DIAGNOSIS — Z1152 Encounter for screening for COVID-19: Secondary | ICD-10-CM | POA: Diagnosis not present

## 2023-01-18 DIAGNOSIS — R0981 Nasal congestion: Secondary | ICD-10-CM | POA: Diagnosis not present

## 2023-01-18 DIAGNOSIS — R5383 Other fatigue: Secondary | ICD-10-CM | POA: Diagnosis not present

## 2023-01-18 DIAGNOSIS — I10 Essential (primary) hypertension: Secondary | ICD-10-CM | POA: Diagnosis not present

## 2023-02-14 DIAGNOSIS — Z961 Presence of intraocular lens: Secondary | ICD-10-CM | POA: Diagnosis not present

## 2023-04-14 ENCOUNTER — Other Ambulatory Visit: Payer: Self-pay | Admitting: Cardiology

## 2023-04-24 DIAGNOSIS — R7302 Impaired glucose tolerance (oral): Secondary | ICD-10-CM | POA: Diagnosis not present

## 2023-04-24 DIAGNOSIS — I10 Essential (primary) hypertension: Secondary | ICD-10-CM | POA: Diagnosis not present

## 2023-04-24 DIAGNOSIS — E7800 Pure hypercholesterolemia, unspecified: Secondary | ICD-10-CM | POA: Diagnosis not present

## 2023-04-24 DIAGNOSIS — H8110 Benign paroxysmal vertigo, unspecified ear: Secondary | ICD-10-CM | POA: Diagnosis not present

## 2023-04-24 DIAGNOSIS — M545 Low back pain, unspecified: Secondary | ICD-10-CM | POA: Diagnosis not present

## 2023-04-24 DIAGNOSIS — K219 Gastro-esophageal reflux disease without esophagitis: Secondary | ICD-10-CM | POA: Diagnosis not present

## 2023-04-24 DIAGNOSIS — D692 Other nonthrombocytopenic purpura: Secondary | ICD-10-CM | POA: Diagnosis not present

## 2023-04-24 DIAGNOSIS — E669 Obesity, unspecified: Secondary | ICD-10-CM | POA: Diagnosis not present

## 2023-05-10 DIAGNOSIS — D225 Melanocytic nevi of trunk: Secondary | ICD-10-CM | POA: Diagnosis not present

## 2023-05-10 DIAGNOSIS — Z85828 Personal history of other malignant neoplasm of skin: Secondary | ICD-10-CM | POA: Diagnosis not present

## 2023-05-10 DIAGNOSIS — L570 Actinic keratosis: Secondary | ICD-10-CM | POA: Diagnosis not present

## 2023-05-10 DIAGNOSIS — L821 Other seborrheic keratosis: Secondary | ICD-10-CM | POA: Diagnosis not present

## 2023-05-10 DIAGNOSIS — D2362 Other benign neoplasm of skin of left upper limb, including shoulder: Secondary | ICD-10-CM | POA: Diagnosis not present

## 2023-05-10 DIAGNOSIS — L820 Inflamed seborrheic keratosis: Secondary | ICD-10-CM | POA: Diagnosis not present

## 2023-07-11 ENCOUNTER — Other Ambulatory Visit: Payer: Self-pay | Admitting: Cardiology

## 2023-07-13 ENCOUNTER — Telehealth: Payer: Self-pay | Admitting: Cardiology

## 2023-07-13 MED ORDER — METOPROLOL TARTRATE 25 MG PO TABS: 12.5000 mg | ORAL_TABLET | Freq: Two times a day (BID) | ORAL | 0 refills | Status: DC

## 2023-07-13 NOTE — Telephone Encounter (Signed)
 Pt's medication was sent to pt's pharmacy as requested. Confirmation received.

## 2023-07-13 NOTE — Telephone Encounter (Signed)
*  STAT* If patient is at the pharmacy, call can be transferred to refill team.   1. Which medications need to be refilled? (please list name of each medication and dose if known) metoprolol  tartrate (LOPRESSOR ) 25 MG tablet   2. Which pharmacy/location (including street and city if local pharmacy) is medication to be sent to?  [id number] [place], [place] - [address]      3. Do they need a 30 day or 90 day supply? 90 day    Pt is out of this medication

## 2023-07-19 ENCOUNTER — Telehealth: Payer: Self-pay | Admitting: Cardiology

## 2023-07-19 MED ORDER — METOPROLOL TARTRATE 25 MG PO TABS: 12.5000 mg | ORAL_TABLET | Freq: Two times a day (BID) | ORAL | 0 refills | Status: DC

## 2023-07-19 NOTE — Telephone Encounter (Signed)
*  STAT* If patient is at the pharmacy, call can be transferred to refill team.   1. Which medications need to be refilled? (please list name of each medication and dose if known) metoprolol  tartrate (LOPRESSOR ) 25 MG tablet    2. Would you like to learn more about the convenience, safety, & potential cost savings by using the [facility]? No   3. Are you open to using the [facility] (Type [facility]. ) No   4. Which pharmacy/location (including street and city if local pharmacy) is medication to be sent to?  [id number] [place], [place] - [address] Phone: [phone]  Fax: [phone]    5. Do they need a 30 day or 90 day supply? 90 day  Pt has scheduled appt on 8/22

## 2023-07-19 NOTE — Telephone Encounter (Signed)
 RX sent to requested Pharmacy

## 2023-09-06 DIAGNOSIS — R0602 Shortness of breath: Secondary | ICD-10-CM | POA: Insufficient documentation

## 2023-09-06 DIAGNOSIS — R002 Palpitations: Secondary | ICD-10-CM | POA: Insufficient documentation

## 2023-09-06 NOTE — Progress Notes (Unsigned)
 Cardiology Office Note:   Date:  09/07/2023  ID:  ASTRID VIDES, DOB 23-Sep-1950, MRN [id number] PCP: Vernadine Charlie ORN, MD  [facility] Providers Cardiologist:  Lynwood Schilling, MD {  History of Present Illness:   Kara Oneill is a 73 y.o. female who presents for evaluation of SOB and palpitations.  She had a low risk perfusion study.  She returns for follow up.  She continues to have dyspnea.  She reports that this is intermittent.  Interestingly it seems to happen after she is eating.  If she does not eat and she goes out to do something such as walking around a big box store she will not get short of breath.  If she does eat and she tries to do that activity she will notice dyspnea with exertion but she is not having any chest pressure, neck or arm discomfort.  She is not having as many palpitations that she had.  She had some ectopy on her monitor when I saw her last and I added metoprolol  12-1/2 mg twice daily.  She thinks this has helped with the palpitations though they are occasional.  I do note that her blood pressure is elevated today.  She does not routinely check it at home.    ROS: As stated in the HPI and negative for all other systems.  Studies Reviewed:    EKG:   EKG Interpretation Date/Time:  Friday September 07 2023 16:02:17 EDT Ventricular Rate:  70 PR Interval:  172 QRS Duration:  80 QT Interval:  380 QTC Calculation: 410 R Axis:   64  Text Interpretation: Normal sinus rhythm Low voltage QRS Cannot rule out Anterior infarct , age undetermined No significant change since last tracing Confirmed by Schilling Lynwood ([id number]) on 09/07/2023 4:43:38 PM     Risk Assessment/Calculations:     Physical Exam:   VS:  BP (!) 166/70   Pulse 70   Ht 5' 2 (1.575 m)   Wt 203 lb (92.1 kg)   SpO2 96%   BMI 37.13 kg/m    Wt Readings from Last 3 Encounters:  09/07/23 203 lb (92.1 kg)  05/12/22 210 lb 6.4 oz (95.4 kg)  02/05/20 192 lb (87.1 kg)     GEN: Well  nourished, well developed in no acute distress NECK: No JVD; No carotid bruits CARDIAC: RRR, soft apical systolic murmur heard also at the left upper sternal border and not increasing with the strain phase of Valsalva, no diastolic murmurs, rubs, gallops RESPIRATORY:  Clear to auscultation without rales, wheezing or rhonchi  ABDOMEN: Soft, non-tender, non-distended EXTREMITIES:  Mild left greater than right leg edema; No deformity   ASSESSMENT AND PLAN:   SOB: She had a normal BNP.  Stress perfusion study was unremarkable.  I am going to check an echocardiogram given the slight murmur.  It was not dynamic although she does have postprandial shortness of breath.  I will consider other provocative testing based on this finding or progressive symptoms.   Palpitations: These are improved.  I will be increasing the beta-blocker as below given her blood pressure.   Pulmonary nodule: She is to have follow-up of incidental pulmonary nodule found on her PET testing.  We will arrange for a noncontrasted CT.  Hypertension: Her blood pressure is slightly elevated.  And can increase metoprolol  to 25 mg twice daily and she is going to keep a blood pressure log.     Follow up with me in 3 months.  Signed, Lynwood Schilling, MD

## 2023-09-07 ENCOUNTER — Encounter: Payer: Self-pay | Admitting: Cardiology

## 2023-09-07 ENCOUNTER — Ambulatory Visit: Attending: Cardiology | Admitting: Cardiology

## 2023-09-07 VITALS — BP 166/70 | HR 70 | Ht 62.0 in | Wt 203.0 lb

## 2023-09-07 DIAGNOSIS — R0602 Shortness of breath: Secondary | ICD-10-CM

## 2023-09-07 DIAGNOSIS — R002 Palpitations: Secondary | ICD-10-CM | POA: Diagnosis not present

## 2023-09-07 MED ORDER — METOPROLOL TARTRATE 25 MG PO TABS: 25.0000 mg | ORAL_TABLET | Freq: Two times a day (BID) | ORAL | 3 refills | Status: AC

## 2023-09-07 NOTE — Patient Instructions (Signed)
 Medication Instructions:  Increase Metoprolol  Tartrate 25 mg twice a day  Continue all other medications *If you need a refill on your cardiac medications before your next appointment, please call your pharmacy*  Lab Work: None ordered  Testing/Procedures: Schedule non contrast  chest ct   Schedule Echo first available   Follow-Up: At [facility], you and your health needs are our priority.  As part of our continuing mission to provide you with exceptional heart care, our providers are all part of one team.  This team includes your primary Cardiologist (physician) and Advanced Practice Providers or APPs (Physician Assistants and Nurse Practitioners) who all work together to provide you with the care you need, when you need it.  Your next appointment:  6 months    Call in Oct to schedule Feb appointment     Provider:  Dr.Hochrein     Monitor blood pressure daily Keep a diary    We recommend signing up for the patient portal called MyChart.  Sign up information is provided on this After Visit Summary.  MyChart is used to connect with patients for Virtual Visits (Telemedicine).  Patients are able to view lab/test results, encounter notes, upcoming appointments, etc.  Non-urgent messages can be sent to your provider as well.   To learn more about what you can do with MyChart, go to [email or web address].

## 2023-09-13 ENCOUNTER — Other Ambulatory Visit: Payer: Self-pay

## 2023-09-13 DIAGNOSIS — R911 Solitary pulmonary nodule: Secondary | ICD-10-CM

## 2023-09-20 ENCOUNTER — Ambulatory Visit (HOSPITAL_COMMUNITY)
Admission: RE | Admit: 2023-09-20 | Discharge: 2023-09-20 | Disposition: A | Discharge status: Home / Self Care | Source: Ambulatory Visit | Attending: Cardiology | Admitting: Cardiology

## 2023-09-20 DIAGNOSIS — J984 Other disorders of lung: Secondary | ICD-10-CM | POA: Diagnosis not present

## 2023-09-20 DIAGNOSIS — K449 Diaphragmatic hernia without obstruction or gangrene: Secondary | ICD-10-CM | POA: Diagnosis not present

## 2023-09-20 DIAGNOSIS — R911 Solitary pulmonary nodule: Secondary | ICD-10-CM | POA: Diagnosis not present

## 2023-09-20 DIAGNOSIS — I700 Atherosclerosis of aorta: Secondary | ICD-10-CM | POA: Diagnosis not present

## 2023-09-20 DIAGNOSIS — R918 Other nonspecific abnormal finding of lung field: Secondary | ICD-10-CM | POA: Diagnosis not present

## 2023-09-26 DIAGNOSIS — Z23 Encounter for immunization: Secondary | ICD-10-CM | POA: Diagnosis not present

## 2023-09-30 ENCOUNTER — Ambulatory Visit: Payer: Self-pay | Admitting: Cardiology

## 2023-10-10 ENCOUNTER — Ambulatory Visit (HOSPITAL_COMMUNITY)
Admission: RE | Admit: 2023-10-10 | Discharge: 2023-10-10 | Disposition: A | Discharge status: Home / Self Care | Source: Ambulatory Visit | Attending: Cardiovascular Disease | Admitting: Cardiovascular Disease

## 2023-10-10 DIAGNOSIS — R002 Palpitations: Secondary | ICD-10-CM | POA: Insufficient documentation

## 2023-10-10 DIAGNOSIS — R0602 Shortness of breath: Secondary | ICD-10-CM | POA: Diagnosis not present

## 2023-10-10 LAB — ECHOCARDIOGRAM COMPLETE
AR max vel: 1.42 cm2
AV Area VTI: 1.49 cm2
AV Area mean vel: 1.42 cm2
AV Mean grad: 8 mmHg
AV Peak grad: 15.1 mmHg
Ao pk vel: 1.94 m/s
Area-P 1/2: 3.42 cm2
MV M vel: 5.18 m/s
MV Peak grad: 107.3 mmHg
MV VTI: 1.67 cm2
S' Lateral: 2.9 cm

## 2023-10-10 MED ORDER — PERFLUTREN LIPID MICROSPHERE
1.0000 mL | INTRAVENOUS | Status: AC | PRN
  Administered 2023-10-10: 3 mL via INTRAVENOUS

## 2023-10-18 DIAGNOSIS — Z1212 Encounter for screening for malignant neoplasm of rectum: Secondary | ICD-10-CM | POA: Diagnosis not present

## 2023-10-18 DIAGNOSIS — E7849 Other hyperlipidemia: Secondary | ICD-10-CM | POA: Diagnosis not present

## 2023-10-18 DIAGNOSIS — E559 Vitamin D deficiency, unspecified: Secondary | ICD-10-CM | POA: Diagnosis not present

## 2023-10-19 DIAGNOSIS — I10 Essential (primary) hypertension: Secondary | ICD-10-CM | POA: Diagnosis not present

## 2023-10-19 DIAGNOSIS — R7302 Impaired glucose tolerance (oral): Secondary | ICD-10-CM | POA: Diagnosis not present

## 2023-10-19 DIAGNOSIS — E7800 Pure hypercholesterolemia, unspecified: Secondary | ICD-10-CM | POA: Diagnosis not present

## 2023-10-22 ENCOUNTER — Ambulatory Visit: Payer: Self-pay | Admitting: Cardiology

## 2023-10-25 DIAGNOSIS — D692 Other nonthrombocytopenic purpura: Secondary | ICD-10-CM | POA: Diagnosis not present

## 2023-10-25 DIAGNOSIS — E7800 Pure hypercholesterolemia, unspecified: Secondary | ICD-10-CM | POA: Diagnosis not present

## 2023-10-25 DIAGNOSIS — Z1339 Encounter for screening examination for other mental health and behavioral disorders: Secondary | ICD-10-CM | POA: Diagnosis not present

## 2023-10-25 DIAGNOSIS — Z1331 Encounter for screening for depression: Secondary | ICD-10-CM | POA: Diagnosis not present

## 2023-10-25 DIAGNOSIS — R82998 Other abnormal findings in urine: Secondary | ICD-10-CM | POA: Diagnosis not present

## 2023-10-25 DIAGNOSIS — E669 Obesity, unspecified: Secondary | ICD-10-CM | POA: Diagnosis not present

## 2023-10-25 DIAGNOSIS — Z0000 Encounter for general adult medical examination without abnormal findings: Secondary | ICD-10-CM | POA: Diagnosis not present

## 2023-10-25 DIAGNOSIS — D126 Benign neoplasm of colon, unspecified: Secondary | ICD-10-CM | POA: Diagnosis not present

## 2023-10-25 DIAGNOSIS — H8110 Benign paroxysmal vertigo, unspecified ear: Secondary | ICD-10-CM | POA: Diagnosis not present

## 2023-10-25 DIAGNOSIS — I10 Essential (primary) hypertension: Secondary | ICD-10-CM | POA: Diagnosis not present

## 2023-10-25 DIAGNOSIS — N3941 Urge incontinence: Secondary | ICD-10-CM | POA: Diagnosis not present

## 2023-10-25 DIAGNOSIS — K219 Gastro-esophageal reflux disease without esophagitis: Secondary | ICD-10-CM | POA: Diagnosis not present

## 2024-03-05 ENCOUNTER — Ambulatory Visit: Admitting: Cardiology
